# Patient Record
Sex: Female | Born: 1975 | Race: White | Hispanic: No | Marital: Married | Smoking: Never smoker
Health system: Southern US, Community
[De-identification: ages and names within clinical notes are randomized; demographics above are authoritative.]

## PROBLEM LIST (undated history)

## (undated) DIAGNOSIS — F329 Major depressive disorder, single episode, unspecified: Secondary | ICD-10-CM

## (undated) DIAGNOSIS — IMO0001 Reserved for inherently not codable concepts without codable children: Secondary | ICD-10-CM

## (undated) DIAGNOSIS — O480 Post-term pregnancy: Secondary | ICD-10-CM

## (undated) DIAGNOSIS — F419 Anxiety disorder, unspecified: Secondary | ICD-10-CM

## (undated) DIAGNOSIS — F32A Depression, unspecified: Secondary | ICD-10-CM

## (undated) DIAGNOSIS — D219 Benign neoplasm of connective and other soft tissue, unspecified: Secondary | ICD-10-CM

## (undated) HISTORY — PX: TONSILLECTOMY: SUR1361

---

## 2001-10-13 ENCOUNTER — Other Ambulatory Visit: Admission: RE | Admit: 2001-10-13 | Discharge: 2001-10-13 | Payer: Self-pay | Admitting: Obstetrics and Gynecology

## 2004-08-28 ENCOUNTER — Ambulatory Visit (HOSPITAL_COMMUNITY): Admission: RE | Admit: 2004-08-28 | Discharge: 2004-08-28 | Payer: Self-pay | Admitting: Family Medicine

## 2004-12-22 ENCOUNTER — Other Ambulatory Visit: Admission: RE | Admit: 2004-12-22 | Discharge: 2004-12-22 | Payer: Self-pay | Admitting: Obstetrics and Gynecology

## 2008-08-31 ENCOUNTER — Inpatient Hospital Stay (HOSPITAL_COMMUNITY): Admission: AD | Admit: 2008-08-31 | Discharge: 2008-08-31 | Payer: Self-pay | Admitting: Obstetrics and Gynecology

## 2008-09-26 ENCOUNTER — Inpatient Hospital Stay (HOSPITAL_COMMUNITY): Admission: AD | Admit: 2008-09-26 | Discharge: 2008-09-26 | Payer: Self-pay | Admitting: Obstetrics

## 2008-10-12 ENCOUNTER — Inpatient Hospital Stay (HOSPITAL_COMMUNITY): Admission: AD | Admit: 2008-10-12 | Discharge: 2008-10-16 | Payer: Self-pay | Admitting: Obstetrics

## 2008-10-13 ENCOUNTER — Encounter (INDEPENDENT_AMBULATORY_CARE_PROVIDER_SITE_OTHER): Payer: Self-pay | Admitting: Obstetrics

## 2009-08-16 ENCOUNTER — Encounter: Admission: RE | Admit: 2009-08-16 | Discharge: 2009-09-26 | Payer: Self-pay | Admitting: Sports Medicine

## 2011-02-27 NOTE — Op Note (Signed)
Desiree Reese, Desiree Reese            ACCOUNT NO.:  [id number]   MEDICAL RECORD NO.:  [id number]          PATIENT TYPE:  INP   LOCATION:  9117                          FACILITY:  [facility]   PHYSICIAN:  Lendon Colonel, MD   DATE OF BIRTH:  06-28-1976   DATE OF PROCEDURE:  10/13/2008  DATE OF DISCHARGE:                               OPERATIVE REPORT   PREOPERATIVE DIAGNOSIS:  Failure to descend.   POSTOPERATIVE DIAGNOSIS:  Failure to descend.   PROCEDURE:  Primary low-transverse cesarean section.   SURGEON:  Lendon Colonel, MD   ASSISTANT:  Lenoard Aden, MD   ANESTHESIA:  Epidural.   FINDINGS:  Female infant in the LOP position.  Apgars 8 and 9.  Normal  tubes and ovaries, 4 cm subserosal fibroid posterior left.   SPECIMENS:  Placenta to L&D.   ANTIBIOTICS:  2 g of Ancef.   ESTIMATED BLOOD LOSS:  1000.   COMPLICATIONS:  None.   INDICATIONS:  This is a 35 year old G1 at 40 weeks and 5 days being  induced for oligohydramnios postdates after greater than 24-hour  induction most of that in a prolonged latency period with subsequent  rapid progression from 4 cm to 10 cm over 4 hours.  The patient began  pushing, after 3 hours of pushing, fetal occiput was unable to pass the  +2 station with significant molding and caput present.  Decision made to  proceed to the operating room for primary cesarean section.   PROCEDURE:  After informed consent was obtained, the patient was taken  to the operating room where general anesthesia was found to be adequate.  She was prepped and draped in normal sterile fashion in a dorsal spine  position with a leftward tilt.  Foley catheter was inserted in the labor  floor.  Pfannenstiel skin incision was made 2 cm above the pubic  symphysis in the midline, carried through to the underlying layer of  fascia with the Bovie cautery.  The fascia was incised in midline and  the incision extended with the Mayo scissors.  The inferior aspect of  fascial incision was grasped with Kocher clamps, elevated up and down.  The rectus muscles were dissected off sharply.  Attention turned to the  inferior aspect of the fascial incision, which in a similar fashion was  grasped with Kocher clamps, elevated up, and the underlying rectus  muscles dissected off sharply.  The rectus muscles separated in the  midline.  Pyramidalis muscles were separated with the Mayo scissors.  The peritoneum was identified and entered bluntly.  Peritoneal incision  was extended superiorly and inferiorly with good visualization of the  bladder.  Bladder blade was inserted.  The skin and peritoneum was  identified, grasped with pickups and entered sharply, incision was  extended laterally with the Metzenbaum scissors and the bladder flap was  created digitally.  Bladder blade was reinserted.  The lower uterine  segment was incised in transverse fashion with a scalpel and extended  bluntly.  The infant's occiput was grasped, flexed, and delivered  without complication.  Nuchal cord x1 was easily reduced.  The remainder  of the infant was delivered without complication.  The cord was clamped  and cut and the infant was handed off to awaiting pediatricians.  The  uterus was exteriorized after the placenta was expressed.  The uterus  was cleared of all clots and debris.  The uterine incision was closed  with 0-Vicryl in a running locked fashion.  A second layer of the same  suture was used in imbricating fashion to obtain hemostasis.  Some  bleeding from the left apex of the incision was noted and stitch was put  for hemostasis and serosal tear occurred.  Brisk bleeding was noted at  this point and several deep sutures were placed to control hemostasis.  This was a major branch of the uterine artery and bleeding was brisk.  Excellent hemostasis was noted.  Attention was turned to the right side  of the uterus.  Some expansion of hematoma on th right side was noted   underneath the serosa.  Approximately 6 figure-of-eight sutures were  needed to control this bleeding.  Once controlled, the uterus was  returned to the abdomen.  The gutters were cleared of clots and debris.  The pelvis was irrigated with warm normal saline.  The incisions were  reinspected.  Hemostasis was noted.  Peritoneum was closed with 2-0  Vicryl in a running fashion.  The cut muscle edges on either side of the  fascia were inspected, found to be hemostatic.  The fascia was closed  with 0-Vicryl in a running fashion in a single layer.  The subcutaneous  tissue was irrigated and several small capillary bleeders were bovied  for hemostasis and the skin was closed with staples.  The patient  tolerated the procedure well.  Sponge, lap, and needle count was correct  x3.  The patient was taken to recovery room in a stable condition.      Lendon Colonel, MD  Electronically Signed     KAF/MEDQ  D:  10/13/2008  T:  10/14/2008  Job:  [id number]

## 2011-03-02 NOTE — Discharge Summary (Signed)
NAMEDEBI, COUSIN            ACCOUNT NO.:  [id number]   MEDICAL RECORD NO.:  [id number]          PATIENT TYPE:  INP   LOCATION:  9117                          FACILITY:  [facility]   PHYSICIAN:  Lendon Colonel, MD   DATE OF BIRTH:  October 09, 1976   DATE OF ADMISSION:  10/12/2008  DATE OF DISCHARGE:  10/16/2008                               DISCHARGE SUMMARY   CHIEF COMPLAINT:  Oligohydramnios.   HISTORY OF PRESENT ILLNESS:  This is a 35 year old G1 presenting at 40  weeks and 5 days, who on postdates testing was found to have an AFI of  6.5, reactive NST, and a BPP of 8/8.  However, the decision was made to  proceed with induction of labor due to postdate oligohydramnios.  Prenatal issues are significant for GBS positive and yeast vaginitis.  Remainder of the past medical, surgical, obstetrical, and gynecologic  history is as per her admission H&P.  On admission, she was afebrile  with stable vitals.  Her exam was benign.  Her cervix was 2 cm dilated,  80% effaced with a vertex in a -2.  Decision was made to begin Pitocin  and AROM when in active labor.  Fetal well-being was reassuring and  penicillin was started for GBS positivity.  Later that day, Pitocin had  gotten up to 20 milliunits per minute, although contractions were rare.  Her cervix progressed to 3, 50, and -2.  There was no adequate amniotic  sac for rupture of membranes.  By 10:00 p.m., on day of admission, the  patient was still very comfortable with her contractions every 2-3  minutes.  Cervix remained at 3 cm and there was an asynclitic  presentation.  By midnight, Pitocin had increased to 24 milliunits per  minute.  The patient is now contracting every 2 minutes and was very  comfortable.  The decision was then made to stop the Pitocin by 3 in the  morning given the cervix had now progressed past 4 cm and the patient  was sleeping without feeling contractions without any medications.  Pitocin was stopped for about 4  hours and then was restarted.  The fetal  tracing remained reactive.  On hospital day #2, the patient finally  began getting uncomfortable and by 1:15 in the p.m., an epidural was  placed.  There was some molding noted.  The cervix had progressed to 5  cm and 90% effaced.  The patient rapidly progressed over the next 4  hours and patient began pushing.  After about 3 hours of pushing without  any fetal descent, decision was made to proceed with a primary cesarean  section. That note can be found in a separate dictation.  In short, baby  was in the OP position with Apgars 8 and 9.  Normal tubes and ovaries  were noted and a 4-cm subserosal fibroid was noted.  EBL of 1000 mL.  Postoperatively, the patient did well.  She had a significant amount of  edema secondary to fluid shift.  She did receive some  hydrochlorothiazide, which did help improve urine output and by postop  day #3,  the patient was discharged to home.   DISCHARGE DIAGNOSES:  Failed induction of labor for postdates  oligohydramnios, status post primary cesarean section.   DISCHARGE DISPOSITION:  To home.   DISCHARGE CONDITION:  Stable.   DISCHARGE MEDICATIONS:  Motrin, Colace, and Percocet.      Lendon Colonel, MD  Electronically Signed     KAF/MEDQ  D:  11/14/2008  T:  11/15/2008  Job:  [id number]

## 2011-07-20 LAB — CBC
HCT: 36.2 % (ref 36.0–46.0)
HCT: 38.4 % (ref 36.0–46.0)
Hemoglobin: 12.1 g/dL (ref 12.0–15.0)
Hemoglobin: 13.1 g/dL (ref 12.0–15.0)
MCHC: 33.5 g/dL (ref 30.0–36.0)
MCHC: 34 g/dL (ref 30.0–36.0)
MCV: 97.7 fL (ref 78.0–100.0)
MCV: 99.8 fL (ref 78.0–100.0)
Platelets: 199 10*3/uL (ref 150–400)
Platelets: 200 10*3/uL (ref 150–400)
RBC: 3.62 MIL/uL — ABNORMAL LOW (ref 3.87–5.11)
RBC: 3.93 MIL/uL (ref 3.87–5.11)
RDW: 14 % (ref 11.5–15.5)
RDW: 14.4 % (ref 11.5–15.5)
WBC: 12.7 10*3/uL — ABNORMAL HIGH (ref 4.0–10.5)
WBC: 23.4 10*3/uL — ABNORMAL HIGH (ref 4.0–10.5)

## 2011-07-20 LAB — RPR: RPR Ser Ql: NONREACTIVE

## 2014-05-03 LAB — OB RESULTS CONSOLE PLATELET COUNT: Platelets: 217 10*3/uL

## 2014-05-03 LAB — OB RESULTS CONSOLE ABO/RH: RH Type: POSITIVE

## 2014-05-03 LAB — OB RESULTS CONSOLE RUBELLA ANTIBODY, IGM: Rubella: IMMUNE

## 2014-05-03 LAB — OB RESULTS CONSOLE RPR: RPR: NONREACTIVE

## 2014-05-03 LAB — OB RESULTS CONSOLE HGB/HCT, BLOOD
HCT: 39 %
Hemoglobin: 13.7 g/dL

## 2014-05-03 LAB — OB RESULTS CONSOLE HEPATITIS B SURFACE ANTIGEN: Hepatitis B Surface Ag: NEGATIVE

## 2014-05-03 LAB — OB RESULTS CONSOLE ANTIBODY SCREEN: Antibody Screen: NEGATIVE

## 2014-05-03 LAB — OB RESULTS CONSOLE HIV ANTIBODY (ROUTINE TESTING): HIV: NONREACTIVE

## 2014-05-03 LAB — OB RESULTS CONSOLE GC/CHLAMYDIA
Chlamydia: NEGATIVE
GC PROBE AMP, GENITAL: NEGATIVE

## 2014-11-11 LAB — OB RESULTS CONSOLE GBS: GBS: NEGATIVE

## 2014-12-15 ENCOUNTER — Inpatient Hospital Stay (HOSPITAL_COMMUNITY): Payer: BLUE CROSS/BLUE SHIELD

## 2014-12-15 ENCOUNTER — Inpatient Hospital Stay (HOSPITAL_COMMUNITY)
Admission: AD | Admit: 2014-12-15 | Discharge: 2014-12-15 | Disposition: A | Discharge status: Home / Self Care | Payer: BLUE CROSS/BLUE SHIELD | Source: Ambulatory Visit | Attending: Obstetrics | Admitting: Obstetrics

## 2014-12-15 ENCOUNTER — Encounter (HOSPITAL_COMMUNITY): Payer: Self-pay | Admitting: *Deleted

## 2014-12-15 DIAGNOSIS — O26899 Other specified pregnancy related conditions, unspecified trimester: Secondary | ICD-10-CM

## 2014-12-15 DIAGNOSIS — O3421 Maternal care for scar from previous cesarean delivery: Secondary | ICD-10-CM | POA: Insufficient documentation

## 2014-12-15 DIAGNOSIS — Z3A40 40 weeks gestation of pregnancy: Secondary | ICD-10-CM | POA: Insufficient documentation

## 2014-12-15 DIAGNOSIS — R109 Unspecified abdominal pain: Secondary | ICD-10-CM

## 2014-12-15 HISTORY — DX: Depression, unspecified: F32.A

## 2014-12-15 HISTORY — DX: Anxiety disorder, unspecified: F41.9

## 2014-12-15 HISTORY — DX: Benign neoplasm of connective and other soft tissue, unspecified: D21.9

## 2014-12-15 HISTORY — DX: Major depressive disorder, single episode, unspecified: F32.9

## 2014-12-15 NOTE — Discharge Instructions (Signed)
Keep your appointment for prenatal care. Call the office or midwife on call with further concerns.

## 2014-12-15 NOTE — MAU Note (Signed)
States she has had UC's during the night. States abdomen feels tight and tender to touch. Feels like it stays tighter longer than it should. Was concerned since she had a previous C/S.

## 2014-12-24 ENCOUNTER — Inpatient Hospital Stay (HOSPITAL_COMMUNITY)
Admission: AD | Admit: 2014-12-24 | Discharge: 2014-12-27 | DRG: 766 | Disposition: A | Discharge status: Home / Self Care | Payer: BLUE CROSS/BLUE SHIELD | Source: Ambulatory Visit | Attending: Obstetrics | Admitting: Obstetrics

## 2014-12-24 ENCOUNTER — Encounter (HOSPITAL_COMMUNITY): Payer: Self-pay | Admitting: *Deleted

## 2014-12-24 DIAGNOSIS — IMO0001 Reserved for inherently not codable concepts without codable children: Secondary | ICD-10-CM

## 2014-12-24 DIAGNOSIS — Z3A41 41 weeks gestation of pregnancy: Secondary | ICD-10-CM | POA: Diagnosis present

## 2014-12-24 DIAGNOSIS — N858 Other specified noninflammatory disorders of uterus: Secondary | ICD-10-CM | POA: Diagnosis present

## 2014-12-24 DIAGNOSIS — F329 Major depressive disorder, single episode, unspecified: Secondary | ICD-10-CM | POA: Diagnosis present

## 2014-12-24 DIAGNOSIS — O3421 Maternal care for scar from previous cesarean delivery: Secondary | ICD-10-CM | POA: Diagnosis present

## 2014-12-24 DIAGNOSIS — O99344 Other mental disorders complicating childbirth: Secondary | ICD-10-CM | POA: Diagnosis present

## 2014-12-24 DIAGNOSIS — F419 Anxiety disorder, unspecified: Secondary | ICD-10-CM | POA: Diagnosis present

## 2014-12-24 DIAGNOSIS — O480 Post-term pregnancy: Secondary | ICD-10-CM

## 2014-12-24 HISTORY — DX: Reserved for inherently not codable concepts without codable children: IMO0001

## 2014-12-24 HISTORY — DX: Post-term pregnancy: O48.0

## 2014-12-24 LAB — CBC
HCT: 42.4 % (ref 36.0–46.0)
HEMOGLOBIN: 14.7 g/dL (ref 12.0–15.0)
MCH: 31.1 pg (ref 26.0–34.0)
MCHC: 34.7 g/dL (ref 30.0–36.0)
MCV: 89.6 fL (ref 78.0–100.0)
PLATELETS: 212 10*3/uL (ref 150–400)
RBC: 4.73 MIL/uL (ref 3.87–5.11)
RDW: 13.4 % (ref 11.5–15.5)
WBC: 13.9 10*3/uL — ABNORMAL HIGH (ref 4.0–10.5)

## 2014-12-24 MED ORDER — OXYTOCIN BOLUS FROM INFUSION
500.0000 mL | INTRAVENOUS | Status: DC
Start: 2014-12-24 — End: 2014-12-25

## 2014-12-24 MED ORDER — CITRIC ACID-SODIUM CITRATE 334-500 MG/5ML PO SOLN
30.0000 mL | ORAL | Status: DC | PRN
  Administered 2014-12-25: 30 mL via ORAL
  Filled 2014-12-24 (×2): qty 15

## 2014-12-24 MED ORDER — ACETAMINOPHEN 325 MG PO TABS: 650.0000 mg | ORAL_TABLET | ORAL | Status: DC | PRN

## 2014-12-24 MED ORDER — LIDOCAINE HCL (PF) 1 % IJ SOLN
INTRAMUSCULAR | Status: DC
Start: 2014-12-24 — End: 2014-12-25
  Filled 2014-12-24: qty 30

## 2014-12-24 MED ORDER — OXYCODONE-ACETAMINOPHEN 5-325 MG PO TABS: 1.0000 | ORAL_TABLET | ORAL | Status: DC | PRN

## 2014-12-24 MED ORDER — LACTATED RINGERS IV SOLN: 500.0000 mL | INTRAVENOUS | Status: DC | PRN

## 2014-12-24 MED ORDER — OXYTOCIN 40 UNITS IN LACTATED RINGERS INFUSION - SIMPLE MED
INTRAVENOUS | Status: AC
  Filled 2014-12-24: qty 1000

## 2014-12-24 MED ORDER — ONDANSETRON HCL 4 MG/2ML IJ SOLN: 4.0000 mg | Freq: Four times a day (QID) | INTRAMUSCULAR | Status: DC | PRN

## 2014-12-24 MED ORDER — LIDOCAINE HCL (PF) 1 % IJ SOLN: 30.0000 mL | INTRAMUSCULAR | Status: DC | PRN

## 2014-12-24 MED ORDER — OXYTOCIN 40 UNITS IN LACTATED RINGERS INFUSION - SIMPLE MED: 62.5000 mL/h | INTRAVENOUS | Status: DC

## 2014-12-24 MED ORDER — OXYCODONE-ACETAMINOPHEN 5-325 MG PO TABS: 2.0000 | ORAL_TABLET | ORAL | Status: DC | PRN

## 2014-12-24 MED ORDER — FLEET ENEMA 7-19 GM/118ML RE ENEM: 1.0000 | ENEMA | Freq: Every day | RECTAL | Status: DC | PRN

## 2014-12-24 MED ORDER — LACTATED RINGERS IV SOLN: INTRAVENOUS | Status: DC

## 2014-12-24 NOTE — H&P (Signed)
OB ADMISSION/ HISTORY & PHYSICAL:  Admission Date: 12/24/2014  9:21 PM  Admit Diagnosis: Active Labor / Postterm pregnancy at 41.4 wks / TOLAC  Desiree Reese is a 39 y.o. female presenting for active labor. She has been contracting since 1645 today.  She was seen in the office today, 3-4/90% with BBOW; membranes stripped.  Prenatal History: G2P1001   EDC : 12/13/2014, LMP& ultrasound  Prenatal care at [facility] since [redacted] weeks gestation Primary Care Provider at [facility]: Dr. Pamala Hurry / CNMs  Prenatal course complicated by previous cesarean delivery / postterm pregnancy  Prenatal Labs: ABO, Rh: O (07/20 0000)  Antibody: Negative (07/20 0000) Rubella: Immune (07/20 0000)  RPR: Nonreactive (07/20 0000)  HBsAg: Negative (07/20 0000)  HIV: Non-reactive (07/20 0000)  GBS: Negative (01/28 0000)  1 hr Glucola : Normal - 134 mg/dL   Medical / Surgical History :  Past medical history:  Past Medical History  Diagnosis Date  . Depression   . Anxiety   . Fibroid   . Active labor 12/24/2014  . Post-term pregnancy, 40-42 weeks of gestation 12/24/2014  . Active labor 12/24/2014     Past surgical history:  Past Surgical History  Procedure Laterality Date  . Cesarean section    . Tonsillectomy       Family History: History reviewed. No pertinent family history.   Social History:  reports that she has never smoked. She has never used smokeless tobacco. She reports that she drinks alcohol. She reports that she does not use illicit drugs.   Allergies: Review of patient's allergies indicates no known allergies.    Current Medications at time of admission:  Prescriptions prior to admission  Medication Sig Dispense Refill Last Dose  . calcium carbonate (TUMS - DOSED IN MG ELEMENTAL CALCIUM) 500 MG chewable tablet Chew 2 tablets by mouth daily as needed for indigestion or heartburn.   12/15/2014 at Unknown time  . Prenatal Vit-Fe Fumarate-FA (PRENATAL  MULTIVITAMIN) TABS tablet Take 1 tablet by mouth daily at 12 noon.   12/14/2014 at Unknown time      Review of Systems: Review of Systems  Constitutional: Negative.   HENT: Negative.   Eyes: Negative.   Respiratory: Negative.   Cardiovascular: Negative.   Gastrointestinal: Negative.   Genitourinary:       UC's since 1645; bloody show and mucousy vaginal d/c  Musculoskeletal: Negative.   Skin: Negative.   Neurological: Negative.   Endo/Heme/Allergies: Negative.   Psychiatric/Behavioral: Negative.          Physical Exam:  Dilation: 8.5 Effacement (%): 100 Station: 0 Exam by:: Renato Battles, CNM   Today's Vitals   12/24/14 2159  Height: 5\' 4"  (1.626 m)  Weight: 75.751 kg (167 lb)  PainSc: 10-Worst pain ever   General: A&O x 3, mildly anxious with contractions Heart: RRR, no murmurs Lungs: CTAB Abdomen: gravid, soft between contractions, non-tender Extremities: atraumatic, normal ROM, no edema Genitalia: normal, no varicosities FHR: 130 / moderate variability / (+) accels / variable decels TOCO: regular every 2 minutes  Labs:    No results found for this or any previous visit (from the past 24 hour(s)).  Assessment:  39 y.o. G2P1001 at [redacted]w[redacted]d  1. Labor: active 2. Fetal Wellbeing: Category 1  3. Pain Control: continuous labor support from spouse, doula , CNM, and RN 4. GBS: Negative 5. TOLAC  Plan:  1. Admit to BS 2. Routine L&D orders 3. Continuous Fetal Monitoring     Dr Pamala Hurry  notified of admission / plan of care    Graceann Congress, MSN, CNM 12/24/2014, 10:06 PM

## 2014-12-25 ENCOUNTER — Encounter (HOSPITAL_COMMUNITY)
Admission: AD | Disposition: A | Discharge status: Home / Self Care | Payer: Self-pay | Source: Ambulatory Visit | Attending: Obstetrics

## 2014-12-25 ENCOUNTER — Inpatient Hospital Stay (HOSPITAL_COMMUNITY): Payer: BLUE CROSS/BLUE SHIELD | Admitting: Anesthesiology

## 2014-12-25 ENCOUNTER — Encounter (HOSPITAL_COMMUNITY): Payer: Self-pay | Admitting: Anesthesiology

## 2014-12-25 SURGERY — Surgical Case
Anesthesia: Epidural | Site: Abdomen

## 2014-12-25 MED ORDER — SCOPOLAMINE 1 MG/3DAYS TD PT72: 1.0000 | MEDICATED_PATCH | Freq: Once | TRANSDERMAL | Status: DC

## 2014-12-25 MED ORDER — SIMETHICONE 80 MG PO CHEW
80.0000 mg | CHEWABLE_TABLET | Freq: Three times a day (TID) | ORAL | Status: DC
  Administered 2014-12-25 – 2014-12-26 (×4): 80 mg via ORAL
  Filled 2014-12-25 (×3): qty 1

## 2014-12-25 MED ORDER — PHENYLEPHRINE 40 MCG/ML (10ML) SYRINGE FOR IV PUSH (FOR BLOOD PRESSURE SUPPORT): 80.0000 ug | PREFILLED_SYRINGE | INTRAVENOUS | Status: DC | PRN

## 2014-12-25 MED ORDER — LANOLIN HYDROUS EX OINT: 1.0000 "application " | TOPICAL_OINTMENT | CUTANEOUS | Status: DC | PRN

## 2014-12-25 MED ORDER — MORPHINE SULFATE (PF) 0.5 MG/ML IJ SOLN
INTRAMUSCULAR | Status: DC | PRN
  Administered 2014-12-25: 4 mg via EPIDURAL

## 2014-12-25 MED ORDER — ONDANSETRON HCL 4 MG/2ML IJ SOLN
INTRAMUSCULAR | Status: AC
  Filled 2014-12-25: qty 2

## 2014-12-25 MED ORDER — ONDANSETRON HCL 4 MG/2ML IJ SOLN: 4.0000 mg | INTRAMUSCULAR | Status: DC | PRN

## 2014-12-25 MED ORDER — NALBUPHINE HCL 10 MG/ML IJ SOLN: 5.0000 mg | Freq: Once | INTRAMUSCULAR | Status: AC | PRN

## 2014-12-25 MED ORDER — LACTATED RINGERS IV SOLN
INTRAVENOUS | Status: DC | PRN
  Administered 2014-12-25: 05:00:00 via INTRAVENOUS

## 2014-12-25 MED ORDER — SODIUM BICARBONATE 8.4 % IV SOLN
INTRAVENOUS | Status: AC
  Filled 2014-12-25: qty 50

## 2014-12-25 MED ORDER — PRENATAL MULTIVITAMIN CH
1.0000 | ORAL_TABLET | Freq: Every day | ORAL | Status: DC
  Administered 2014-12-26: 1 via ORAL

## 2014-12-25 MED ORDER — DIPHENHYDRAMINE HCL 50 MG/ML IJ SOLN: 12.5000 mg | INTRAMUSCULAR | Status: DC | PRN

## 2014-12-25 MED ORDER — MORPHINE SULFATE 0.5 MG/ML IJ SOLN
INTRAMUSCULAR | Status: AC
  Filled 2014-12-25: qty 10

## 2014-12-25 MED ORDER — PHENYLEPHRINE 40 MCG/ML (10ML) SYRINGE FOR IV PUSH (FOR BLOOD PRESSURE SUPPORT)
80.0000 ug | PREFILLED_SYRINGE | INTRAVENOUS | Status: DC | PRN
  Filled 2014-12-25: qty 20

## 2014-12-25 MED ORDER — SCOPOLAMINE 1 MG/3DAYS TD PT72
MEDICATED_PATCH | TRANSDERMAL | Status: AC
  Filled 2014-12-25: qty 1

## 2014-12-25 MED ORDER — CEFAZOLIN SODIUM-DEXTROSE 2-3 GM-% IV SOLR
2.0000 g | Freq: Once | INTRAVENOUS | Status: DC
  Filled 2014-12-25: qty 50

## 2014-12-25 MED ORDER — KETOROLAC TROMETHAMINE 30 MG/ML IJ SOLN: 30.0000 mg | Freq: Four times a day (QID) | INTRAMUSCULAR | Status: AC | PRN

## 2014-12-25 MED ORDER — OXYTOCIN 40 UNITS IN LACTATED RINGERS INFUSION - SIMPLE MED: 62.5000 mL/h | INTRAVENOUS | Status: AC

## 2014-12-25 MED ORDER — HYDROMORPHONE HCL 1 MG/ML IJ SOLN
INTRAMUSCULAR | Status: AC
  Administered 2014-12-25: 0.5 mg via INTRAVENOUS
  Filled 2014-12-25: qty 1

## 2014-12-25 MED ORDER — DEXAMETHASONE SODIUM PHOSPHATE 10 MG/ML IJ SOLN
INTRAMUSCULAR | Status: AC
  Filled 2014-12-25: qty 1

## 2014-12-25 MED ORDER — ZOLPIDEM TARTRATE 5 MG PO TABS: 5.0000 mg | ORAL_TABLET | Freq: Every evening | ORAL | Status: DC | PRN

## 2014-12-25 MED ORDER — ONDANSETRON HCL 4 MG PO TABS: 4.0000 mg | ORAL_TABLET | ORAL | Status: DC | PRN

## 2014-12-25 MED ORDER — SIMETHICONE 80 MG PO CHEW
80.0000 mg | CHEWABLE_TABLET | ORAL | Status: DC
  Administered 2014-12-25 – 2014-12-27 (×2): 80 mg via ORAL
  Filled 2014-12-25: qty 1

## 2014-12-25 MED ORDER — MEPERIDINE HCL 25 MG/ML IJ SOLN: 6.2500 mg | INTRAMUSCULAR | Status: DC | PRN

## 2014-12-25 MED ORDER — ONDANSETRON HCL 4 MG/2ML IJ SOLN
INTRAMUSCULAR | Status: DC | PRN
  Administered 2014-12-25: 4 mg via INTRAVENOUS

## 2014-12-25 MED ORDER — KETOROLAC TROMETHAMINE 30 MG/ML IJ SOLN
INTRAMUSCULAR | Status: AC
Start: 2014-12-25 — End: 2014-12-25
  Administered 2014-12-25: 30 mg via INTRAVENOUS
  Filled 2014-12-25: qty 1

## 2014-12-25 MED ORDER — KETOROLAC TROMETHAMINE 30 MG/ML IJ SOLN
30.0000 mg | Freq: Once | INTRAMUSCULAR | Status: AC | PRN
  Administered 2014-12-25: 30 mg via INTRAVENOUS

## 2014-12-25 MED ORDER — SODIUM CHLORIDE 0.9 % IJ SOLN: 3.0000 mL | INTRAMUSCULAR | Status: DC | PRN

## 2014-12-25 MED ORDER — FENTANYL 2.5 MCG/ML BUPIVACAINE 1/10 % EPIDURAL INFUSION (WH - ANES)
14.0000 mL/h | INTRAMUSCULAR | Status: DC | PRN
  Filled 2014-12-25: qty 125

## 2014-12-25 MED ORDER — NALOXONE HCL 1 MG/ML IJ SOLN: 1.0000 ug/kg/h | INTRAVENOUS | Status: DC | PRN

## 2014-12-25 MED ORDER — MENTHOL 3 MG MT LOZG
1.0000 | LOZENGE | OROMUCOSAL | Status: DC | PRN
  Administered 2014-12-25: 3 mg via ORAL
  Filled 2014-12-25: qty 9

## 2014-12-25 MED ORDER — IBUPROFEN 600 MG PO TABS
600.0000 mg | ORAL_TABLET | Freq: Four times a day (QID) | ORAL | Status: DC
  Administered 2014-12-25 – 2014-12-27 (×7): 600 mg via ORAL
  Filled 2014-12-25 (×6): qty 1

## 2014-12-25 MED ORDER — OXYCODONE-ACETAMINOPHEN 5-325 MG PO TABS
2.0000 | ORAL_TABLET | ORAL | Status: DC | PRN
  Administered 2014-12-26 – 2014-12-27 (×3): 2 via ORAL
  Filled 2014-12-25 (×3): qty 2

## 2014-12-25 MED ORDER — NALBUPHINE HCL 10 MG/ML IJ SOLN: 5.0000 mg | INTRAMUSCULAR | Status: DC | PRN

## 2014-12-25 MED ORDER — SODIUM BICARBONATE 8.4 % IV SOLN
INTRAVENOUS | Status: DC | PRN
  Administered 2014-12-25 (×3): 5 mL via EPIDURAL

## 2014-12-25 MED ORDER — EPHEDRINE 5 MG/ML INJ: 10.0000 mg | INTRAVENOUS | Status: DC | PRN

## 2014-12-25 MED ORDER — PROMETHAZINE HCL 25 MG/ML IJ SOLN
6.2500 mg | INTRAMUSCULAR | Status: DC | PRN
Start: 2014-12-25 — End: 2014-12-25

## 2014-12-25 MED ORDER — HYDROMORPHONE HCL 1 MG/ML IJ SOLN
0.2500 mg | INTRAMUSCULAR | Status: DC | PRN
  Administered 2014-12-25: 0.5 mg via INTRAVENOUS

## 2014-12-25 MED ORDER — DIPHENHYDRAMINE HCL 25 MG PO CAPS: 25.0000 mg | ORAL_CAPSULE | Freq: Four times a day (QID) | ORAL | Status: DC | PRN

## 2014-12-25 MED ORDER — 0.9 % SODIUM CHLORIDE (POUR BTL) OPTIME
TOPICAL | Status: DC | PRN
  Administered 2014-12-25: 1000 mL

## 2014-12-25 MED ORDER — LIDOCAINE-EPINEPHRINE (PF) 2 %-1:200000 IJ SOLN
INTRAMUSCULAR | Status: AC
  Filled 2014-12-25: qty 20

## 2014-12-25 MED ORDER — OXYTOCIN 10 UNIT/ML IJ SOLN
INTRAMUSCULAR | Status: AC
  Filled 2014-12-25: qty 4

## 2014-12-25 MED ORDER — IBUPROFEN 600 MG PO TABS
600.0000 mg | ORAL_TABLET | Freq: Four times a day (QID) | ORAL | Status: DC | PRN
Start: 2014-12-25 — End: 2014-12-25

## 2014-12-25 MED ORDER — WITCH HAZEL-GLYCERIN EX PADS: 1.0000 "application " | MEDICATED_PAD | CUTANEOUS | Status: DC | PRN

## 2014-12-25 MED ORDER — CEFAZOLIN SODIUM-DEXTROSE 2-3 GM-% IV SOLR
INTRAVENOUS | Status: AC
  Filled 2014-12-25: qty 50

## 2014-12-25 MED ORDER — FENTANYL 2.5 MCG/ML BUPIVACAINE 1/10 % EPIDURAL INFUSION (WH - ANES)
INTRAMUSCULAR | Status: DC | PRN
  Administered 2014-12-25: 14 mL/h via EPIDURAL

## 2014-12-25 MED ORDER — SENNOSIDES-DOCUSATE SODIUM 8.6-50 MG PO TABS
2.0000 | ORAL_TABLET | ORAL | Status: DC
  Administered 2014-12-25 – 2014-12-27 (×2): 2 via ORAL
  Filled 2014-12-25 (×2): qty 2

## 2014-12-25 MED ORDER — NALOXONE HCL 0.4 MG/ML IJ SOLN: 0.4000 mg | INTRAMUSCULAR | Status: DC | PRN

## 2014-12-25 MED ORDER — DIBUCAINE 1 % RE OINT: 1.0000 "application " | TOPICAL_OINTMENT | RECTAL | Status: DC | PRN

## 2014-12-25 MED ORDER — OXYTOCIN 10 UNIT/ML IJ SOLN
40.0000 [IU] | INTRAVENOUS | Status: DC | PRN
  Administered 2014-12-25: 40 [IU] via INTRAVENOUS

## 2014-12-25 MED ORDER — SIMETHICONE 80 MG PO CHEW
80.0000 mg | CHEWABLE_TABLET | ORAL | Status: DC | PRN
  Administered 2014-12-27 (×2): 80 mg via ORAL
  Filled 2014-12-25 (×2): qty 1

## 2014-12-25 MED ORDER — CEFAZOLIN SODIUM-DEXTROSE 2-3 GM-% IV SOLR
INTRAVENOUS | Status: DC | PRN
  Administered 2014-12-25: 2 g via INTRAVENOUS

## 2014-12-25 MED ORDER — LACTATED RINGERS IV SOLN
INTRAVENOUS | Status: DC
  Administered 2014-12-25: 15:00:00 via INTRAVENOUS

## 2014-12-25 MED ORDER — DIPHENHYDRAMINE HCL 25 MG PO CAPS: 25.0000 mg | ORAL_CAPSULE | ORAL | Status: DC | PRN

## 2014-12-25 MED ORDER — LIDOCAINE HCL (PF) 1 % IJ SOLN
INTRAMUSCULAR | Status: DC | PRN
Start: 2014-12-25 — End: 2014-12-25
  Administered 2014-12-25 (×2): 8 mL

## 2014-12-25 MED ORDER — ONDANSETRON HCL 4 MG/2ML IJ SOLN: 4.0000 mg | Freq: Three times a day (TID) | INTRAMUSCULAR | Status: DC | PRN

## 2014-12-25 MED ORDER — DEXAMETHASONE SODIUM PHOSPHATE 10 MG/ML IJ SOLN
INTRAMUSCULAR | Status: DC | PRN
  Administered 2014-12-25: 10 mg via INTRAVENOUS

## 2014-12-25 MED ORDER — NALBUPHINE HCL 10 MG/ML IJ SOLN
5.0000 mg | INTRAMUSCULAR | Status: DC | PRN
  Administered 2014-12-25: 5 mg via INTRAVENOUS
  Filled 2014-12-25: qty 1

## 2014-12-25 MED ORDER — SCOPOLAMINE 1 MG/3DAYS TD PT72
MEDICATED_PATCH | TRANSDERMAL | Status: DC | PRN
  Administered 2014-12-25: 1 via TRANSDERMAL

## 2014-12-25 MED ORDER — OXYCODONE-ACETAMINOPHEN 5-325 MG PO TABS
1.0000 | ORAL_TABLET | ORAL | Status: DC | PRN
  Administered 2014-12-26: 1 via ORAL
  Filled 2014-12-25: qty 1

## 2014-12-25 MED ORDER — LACTATED RINGERS IV SOLN
INTRAVENOUS | Status: DC | PRN
  Administered 2014-12-25 (×4): via INTRAVENOUS

## 2014-12-25 MED ORDER — LACTATED RINGERS IV SOLN
500.0000 mL | Freq: Once | INTRAVENOUS | Status: AC
  Administered 2014-12-25: 500 mL via INTRAVENOUS

## 2014-12-25 MED ORDER — TETANUS-DIPHTH-ACELL PERTUSSIS 5-2.5-18.5 LF-MCG/0.5 IM SUSP: 0.5000 mL | Freq: Once | INTRAMUSCULAR | Status: DC

## 2014-12-25 SURGICAL SUPPLY — 37 items
APL SKNCLS STERI-STRIP NONHPOA (GAUZE/BANDAGES/DRESSINGS) ×1
BENZOIN TINCTURE PRP APPL 2/3 (GAUZE/BANDAGES/DRESSINGS) ×1 IMPLANT
CLAMP CORD UMBIL (MISCELLANEOUS) ×1 IMPLANT
CLOTH BEACON ORANGE TIMEOUT ST (SAFETY) ×2 IMPLANT
CONTAINER PREFILL 10% NBF 15ML (MISCELLANEOUS) IMPLANT
DRAPE SHEET LG 3/4 BI-LAMINATE (DRAPES) ×1 IMPLANT
DRSG OPSITE POSTOP 4X10 (GAUZE/BANDAGES/DRESSINGS) ×2 IMPLANT
DURAPREP 26ML APPLICATOR (WOUND CARE) ×2 IMPLANT
ELECT REM PT RETURN 9FT ADLT (ELECTROSURGICAL) ×2
ELECTRODE REM PT RTRN 9FT ADLT (ELECTROSURGICAL) ×1 IMPLANT
EXTRACTOR VACUUM M CUP 4 TUBE (SUCTIONS) IMPLANT
GLOVE BIO SURGEON STRL SZ 6.5 (GLOVE) ×2 IMPLANT
GLOVE BIOGEL PI IND STRL 7.0 (GLOVE) ×1 IMPLANT
GLOVE BIOGEL PI INDICATOR 7.0 (GLOVE) ×1
GOWN STRL REUS W/TWL LRG LVL3 (GOWN DISPOSABLE) ×4 IMPLANT
KIT ABG SYR 3ML LUER SLIP (SYRINGE) IMPLANT
NDL HYPO 25X5/8 SAFETYGLIDE (NEEDLE) IMPLANT
NEEDLE HYPO 25X5/8 SAFETYGLIDE (NEEDLE) IMPLANT
NS IRRIG 1000ML POUR BTL (IV SOLUTION) ×2 IMPLANT
PACK C SECTION WH (CUSTOM PROCEDURE TRAY) ×2 IMPLANT
PAD OB MATERNITY 4.3X12.25 (PERSONAL CARE ITEMS) ×2 IMPLANT
STAPLER VISISTAT 35W (STAPLE) IMPLANT
STRIP CLOSURE SKIN 1/2X4 (GAUZE/BANDAGES/DRESSINGS) ×1 IMPLANT
SUT MON AB 4-0 PS1 27 (SUTURE) ×1 IMPLANT
SUT PLAIN 0 NONE (SUTURE) IMPLANT
SUT PLAIN 2 0 (SUTURE) ×2
SUT PLAIN 2 0 XLH (SUTURE) IMPLANT
SUT PLAIN ABS 2-0 CT1 27XMFL (SUTURE) IMPLANT
SUT VIC AB 0 CT1 36 (SUTURE) ×5 IMPLANT
SUT VIC AB 0 CTX 36 (SUTURE) ×8
SUT VIC AB 0 CTX36XBRD ANBCTRL (SUTURE) ×2 IMPLANT
SUT VIC AB 2-0 CT1 27 (SUTURE) ×2
SUT VIC AB 2-0 CT1 TAPERPNT 27 (SUTURE) ×1 IMPLANT
SUT VIC AB 3-0 SH 27 (SUTURE) ×2
SUT VIC AB 3-0 SH 27X BRD (SUTURE) IMPLANT
TOWEL OR 17X24 6PK STRL BLUE (TOWEL DISPOSABLE) ×2 IMPLANT
TRAY FOLEY CATH 14FR (SET/KITS/TRAYS/PACK) IMPLANT

## 2014-12-25 NOTE — Op Note (Signed)
12/24/2014 - 12/25/2014  5:53 AM  PATIENT:  Desiree Reese  39 y.o. female  PRE-OPERATIVE DIAGNOSIS:  Arrested Descent, failed TOLAC  POST-OPERATIVE DIAGNOSIS:  Arrested Descent,  failed TOLAC  PROCEDURE:  Procedure(s): CESAREAN SECTION (N/A)  RCS  SURGEON:  Surgeon(s) and Role:    * Aloha Gell, MD - Primary  PHYSICIAN ASSISTANT:   ASSISTANTS: Renato Battles, CNM   ANESTHESIA:   epidural  EBL:  Total I/O In: 3600 [I.V.:3600] Out: 950 [Urine:150; Blood:800]  BLOOD ADMINISTERED:none  DRAINS: Urinary Catheter (Foley)   LOCAL MEDICATIONS USED:  NONE  SPECIMEN:  Source of Specimen:  placenta  DISPOSITION OF SPECIMEN:  L&D  COUNTS:  YES  TOURNIQUET:  * No tourniquets in log *  DICTATION: .Note written in EPIC  PLAN OF CARE: Admit to inpatient   PATIENT DISPOSITION:  PACU - hemodynamically stable.   Delay start of Pharmacological VTE agent (>24hrs) due to surgical blood loss or risk of bleeding: yes   Findings:  @BABYSEXEBC @ infant,  APGAR (1 MIN): 8   APGAR (5 MINS): 9   APGAR (10 MINS):   Normal uterus, tubes and ovaries, normal placenta with marginal cord insertion. 3VC, clear amniotic fluid Bladder adhesed to anterior uterus  EBL: per anasthesis Antibiotics:   2g Ancef Complications: none  Indications: This is a 39 y.o. year-old, G2P1  At [redacted]w[redacted]d admitted for active labor. Pt labored on own all day at home, presented 8 pm and was 8.5 cm. Given desire for natural labor she was allowed to progress on her own without interventions or exam. By 2 am she was 9.5 cm, Over the next 2 hrs she progressed to full dilation but no descent of the fetal head and decision made for c/s. Risks benefits and alternatives of the procedure were discussed with the patient who agreed to proceed.  Procedure:  After informed consent was obtained the patient was taken to the operating room where epidural anesthesia was found to be adequate.  She was prepped and draped in the normal  sterile fashion in dorsal supine position with a leftward tilt.  A foley catheter was in place.  A Pfannenstiel skin incision was made 2 cm above the pubic symphysis in the midline with the scalpel.  Dissection was carried down with the Bovie cautery until the fascia was reached. The fascia was incised in the midline. The incision was extended laterally with the Mayo scissors. The inferior aspect of the fascial incision was grasped with the Coker clamps, elevated up and the underlying rectus muscles were dissected off sharply. The superior aspect of the fascial incision was grasped with the Coker clamps elevated up and the underlying rectus muscles were dissected off sharply.  The peritoneum was entered sharply. The peritoneal incision was extended superiorly and inferiorly with good visualization of the bladder.  The bladder was adhesed to the anterior uterine wall and taken down sharply. The bladder blade was inserted and palpation was done to assess the fetal position and the location of the uterine vessels. The lower segment of the uterus was incised sharply with the scalpel and extended  bluntly in the cephalo-caudal fashion. The infant was grasped, brought to the incision,  rotated and the infant was delivered with fundal pressure.  The cord was clamped and cut. The infant was handed off to the waiting pediatrician. The placenta was expressed. The uterus was exteriorized. The uterus was cleared of all clots and debris. The uterine incision was repaired with 0 Vicryl in a running locked  fashion.  A second layer of the same suture was used in an imbricating fashion to obtain excellent hemostasis. Several figure of 8 sutures were used in the R angle and along the myometrium superior to the incision and the the right of midline.  The uterus was then returned to the abdomen, the gutters were cleared of all clots and debris. The uterine incision was reinspected and found to be hemostatic. The peritoneum was grasped  and closed with 2-0 Vicryl in a running fashion. The cut muscle edges and the underside of the fascia were inspected and found to be hemostatic. The fascia was closed with 0 Vicryl in two halves . The subcutaneous tissue was irrigated. Scarpa's layer was closed with a 2-0 plain gut suture. The skin was closed with a 4-0 Monocryl in a single layer. The patient tolerated the procedure well. Sponge lap and needle counts were correct x3 and patient was taken to the recovery room in a stable condition.  Vandy Fong A. 12/25/2014 5:55 AM

## 2014-12-25 NOTE — Progress Notes (Signed)
Met with pt to discuss lack of progress.   H/o PCS for arrest of descent.  1cm change from 8.5 to 9.5 cm in 8 hrs  A/P: Arrest of descent. R of procedure d/w pt including bleeding, infection, damage to internal organs  Plan 2 g Ancef Pt agrees to plan  Tashawn Laswell A. 12/25/2014 4:32 AM

## 2014-12-25 NOTE — Transfer of Care (Signed)
Immediate Anesthesia Transfer of Care Note  Patient: Desiree Reese  Procedure(s) Performed: Procedure(s): CESAREAN SECTION (N/A)  Patient Location: PACU  Anesthesia Type:Epidural  Level of Consciousness: awake  Airway & Oxygen Therapy: Patient Spontanous Breathing  Post-op Assessment: Report given to RN and Post -op Vital signs reviewed and stable  Post vital signs: stable  Last Vitals:  Filed Vitals:   12/25/14 0401  BP: 136/99  Pulse: 111  Temp:   Resp:     Complications: No apparent anesthesia complications

## 2014-12-25 NOTE — Anesthesia Postprocedure Evaluation (Signed)
Anesthesia Post Note  Patient: Desiree Reese  Procedure(s) Performed: Procedure(s) (LRB): CESAREAN SECTION (N/A)  Anesthesia type: Epidural  Patient location: Mother/Baby  Post pain: Pain level controlled  Post assessment: Post-op Vital signs reviewed  Last Vitals:  Filed Vitals:   12/25/14 1042  BP: 111/71  Pulse: 70  Temp: 36.6 C  Resp: 18    Post vital signs: Reviewed  Level of consciousness:alert  Complications: No apparent anesthesia complications

## 2014-12-25 NOTE — Progress Notes (Signed)
Patient ID: Desiree Reese, female   DOB: 23-Sep-1976, 39 y.o.   MRN: [id number] S: Doing well, pain well-controlled with an epidural. (+) pelvic pressure with some contractions. Requesting a repeat cesarean delivery d/t no progress since last exam.   O: Filed Vitals:   12/25/14 0250 12/25/14 0255 12/25/14 0300 12/25/14 0330  BP: 137/75 132/70 120/81 114/81  Pulse: 100 85 85 102  Temp:   97.6 F (36.4 C)   TempSrc:   Oral   Resp:   16 18  Height:      Weight:      SpO2:         FHT:  FHR: 130 bpm, variability: moderate,  accelerations:  Present,  decelerations:  Present occ variable UC:   regular, every 4-8 minutes SVE:   Dilation: Lip/rim Effacement (%): 100 Station: 0 Exam by:: R.Aissa Lisowski, CNM   A / P: Arrest in active phase of labor  Arrest of descent  Fetal Wellbeing:  Category I Pain Control:  Epidural  Anticipated MOD:  cesrean delivery   *Dr. Pamala Hurry in room discussing surgery with patient and spouse.  Laury Deep, M MSN, CNM 12/25/2014, 4:03 AM

## 2014-12-25 NOTE — Progress Notes (Signed)
Patient ID: Desiree Reese, female   DOB: [birth year], 38 y.o.   MRN: [id number] S: Increasing anxiety with contractions, pain minimally tolerated d/t fatigue. After many position changes (seated, standing, rocking with support of spouse, hands and knees, and exaggerated Sims), patient is requesting an epidural for pain management. (+)  pelvic pressure with contractions. Continuous support of spouse, doula, and CNM.  ODanley Danker Vitals:   12/24/14 2334 12/25/14 0050 12/25/14 0051 12/25/14 0116  BP: 118/71     Pulse: 122 84 89 100  Temp:      TempSrc:      Height:      Weight:      SpO2:    97%     FHT:  FHR: 130 bpm, variability: moderate,  accelerations:  Present,  decelerations:  Present variables / intermittent tracing of maternal HR d/t frequent maternal position changes UC:   regular, every 2-3 minutes SVE:   Dilation: Lip/rim Effacement (%): 100 Station: 0 Exam by:: Sunday Corn, CNM   A / P: Protracted active phase  TOLAC Postterm at 41.5 wks  Fetal Wellbeing:  Category I Pain Control:  Epidural - Dr. Jillyn Hidden at bedside now Left exaggerated Sims position with peanut ball after epidural placement Plan to recheck cervix in 1 hour - consider AROM  Anticipated MOD:  Cautious for NSVD   *Dr. Pamala Hurry consulted/made aware of labor progress and plan to proceed with epidural  Laury Deep, M MSN, CNM 12/25/2014, 2:10 AM

## 2014-12-25 NOTE — Brief Op Note (Signed)
12/24/2014 - 12/25/2014  5:53 AM  PATIENT:  Desiree Reese  39 y.o. female  PRE-OPERATIVE DIAGNOSIS:  Arrested Descent, failed TOLAC  POST-OPERATIVE DIAGNOSIS:  Arrested Descent,  failed TOLAC  PROCEDURE:  Procedure(s): CESAREAN SECTION (N/A)  RCS  SURGEON:  Surgeon(s) and Role:    * Aloha Gell, MD - Primary  PHYSICIAN ASSISTANT:   ASSISTANTS: Renato Battles, CNM   ANESTHESIA:   epidural  EBL:  Total I/O In: 3600 [I.V.:3600] Out: 950 [Urine:150; Blood:800]  BLOOD ADMINISTERED:none  DRAINS: Urinary Catheter (Foley)   LOCAL MEDICATIONS USED:  NONE  SPECIMEN:  Source of Specimen:  placenta  DISPOSITION OF SPECIMEN:  L&D  COUNTS:  YES  TOURNIQUET:  * No tourniquets in log *  DICTATION: .Note written in EPIC  PLAN OF CARE: Admit to inpatient   PATIENT DISPOSITION:  PACU - hemodynamically stable.   Delay start of Pharmacological VTE agent (>24hrs) due to surgical blood loss or risk of bleeding: yes

## 2014-12-25 NOTE — Lactation Note (Signed)
This note was copied from the chart of [facility]. Lactation Consultation Note Experienced BF mom BF her 8 yr. Old daughter for 6 months, but had to supplement d/t low milk supply. Mom stated she done everything she could think of to increase it including taking medication. Denies hx. Of PCOS.  Noticed small space between breast, breast are not longated or tubular. Good everted nipples for deep latch. Mom stated her daughter had a tongue tie. Noted this baby has upper lip labial frenulum, but has good mobility of tongue. Noted w/assessment of suck on gloved finger that baby's left corner side of mouth seal was a little weak at this time. Had good tongue positioning around finger w/good suckling. When entered rm. Mom was BF STS in cradle position body turned inwards down towards mom and baby's head turned upwards to breast. Assisted in positioning, discussed options and assisted in football hold. Mom was excited to see photos in Baby in Me that she took. Referred to Baby and Me Book in Breastfeeding section Pg. 22-23 for position options and Proper latch demonstration.Encouraged comfort during BF so colostrum flows better and mom will enjoy the feeding longer. Taking deep breaths and breast massage during BF.  Discussed deep latch verse shallow latch and causing soreness.  Hand expression reviewed and demonstrated good flow, mom excited. Encouraged breast massage during BF. Since mom has Hx: of low milk supply encouraged to set up DEBP and post-pump after BF. Mom wanted to wait 24 hrs. To see how baby nurses. Stated it stressed her out last time all of the pumping. I stated I can understand and know how tiring that could be, but it is important to start as soon as possible to encourage her milk supply and explained supply and demand, how first 2 weeks lays foundation of furture milk supply. Mom asked for hand pump. Gave her one as requested. Encouraged hand expression at least 5 times a day in the  first 3 days as studies show has increased milk supply but the 8th day. Mom states she would do that.  Mom getting sleepy from BF. Mom encouraged to feed baby 8-12 times/24 hours and with feeding cues.  Educated about newborn behavior. Mom encouraged to do skin-to-skin increases milk supply and bonding. [place] brochure given w/resources, support groups and [place] services. Patient Name: Desiree Reese NOMVE'H Date: 12/25/2014 Reason for consult: Initial assessment   Maternal Data Has patient been taught Hand Expression?: Yes Does the patient have breastfeeding experience prior to this delivery?: Yes  Feeding Feeding Type: Breast Fed Length of feed: 30 min  LATCH Score/Interventions Latch: Grasps breast easily, tongue down, lips flanged, rhythmical sucking.  Audible Swallowing: A few with stimulation Intervention(s): Alternate breast massage;Hand expression  Type of Nipple: Everted at rest and after stimulation  Comfort (Breast/Nipple): Soft / non-tender     Hold (Positioning): Assistance needed to correctly position infant at breast and maintain latch. Intervention(s): Skin to skin;Position options;Support Pillows;Breastfeeding basics reviewed  LATCH Score: 8  Lactation Tools Discussed/Used Tools: Pump Breast pump type: Manual Pump Review: Setup, frequency, and cleaning;Milk Storage Initiated by:: Allayne Stack RN Date initiated:: 12/25/14   Consult Status Consult Status: Follow-up Date: 12/26/14 Follow-up type: In-patient    Theodoro Kalata 12/25/2014, 11:29 AM

## 2014-12-25 NOTE — Anesthesia Procedure Notes (Signed)
Epidural Patient location during procedure: OB Start time: 12/25/2014 2:09 AM End time: 12/25/2014 2:13 AM  Staffing Anesthesiologist: Lyn Hollingshead Performed by: anesthesiologist   Preanesthetic Checklist Completed: patient identified, surgical consent, pre-op evaluation, timeout performed, IV checked, risks and benefits discussed and monitors and equipment checked  Epidural Patient position: sitting Prep: site prepped and draped and DuraPrep Patient monitoring: continuous pulse ox and blood pressure Approach: midline Location: L3-L4 Injection technique: LOR air  Needle:  Needle type: Tuohy  Needle gauge: 17 G Needle length: 9 cm and 9 Needle insertion depth: 5 cm cm Catheter type: closed end flexible Catheter size: 19 Gauge Catheter at skin depth: 10 cm Test dose: negative and Other  Assessment Sensory level: T9 Events: blood not aspirated, injection not painful, no injection resistance, negative IV test and no paresthesia  Additional Notes Reason for block:procedure for pain

## 2014-12-25 NOTE — Anesthesia Postprocedure Evaluation (Signed)
  Anesthesia Post-op Note  Patient: [name]  Procedure(s) Performed: Procedure(s): CESAREAN SECTION (N/A)  Patient Location: PACU  Anesthesia Type:Epidural  Level of Consciousness: awake, alert  and oriented  Airway and Oxygen Therapy: Patient Spontanous Breathing  Post-op Pain: none  Post-op Assessment: Post-op Vital signs reviewed, Patient's Cardiovascular Status Stable, Respiratory Function Stable, Patent Airway, No signs of Nausea or vomiting, Pain level controlled, No headache, No backache, No residual numbness and No residual motor weakness  Post-op Vital Signs: Reviewed and stable  Last Vitals:  Filed Vitals:   12/25/14 0715  BP: 113/65  Pulse: 73  Temp: 36.4 C  Resp: 20    Complications: No apparent anesthesia complications

## 2014-12-25 NOTE — Addendum Note (Signed)
Addendum  created 12/25/14 1049 by Flossie Dibble, CRNA   Modules edited: Notes Section   Notes Section:  File: [id number]

## 2014-12-25 NOTE — Anesthesia Preprocedure Evaluation (Addendum)
Anesthesia Evaluation  Patient identified by MRN, date of birth, ID band Patient awake    Reviewed: Allergy & Precautions, H&P , NPO status , Patient's Chart, lab work & pertinent test results  Airway Mallampati: I  TM Distance: >3 FB Neck ROM: full    Dental no notable dental hx.    Pulmonary neg pulmonary ROS,    Pulmonary exam normal       Cardiovascular negative cardio ROS      Neuro/Psych negative neurological ROS     GI/Hepatic negative GI ROS, Neg liver ROS,   Endo/Other  negative endocrine ROS  Renal/GU negative Renal ROS     Musculoskeletal   Abdominal Normal abdominal exam  (+)   Peds  Hematology negative hematology ROS (+)   Anesthesia Other Findings   Reproductive/Obstetrics (+) Pregnancy                            Anesthesia Physical Anesthesia Plan  ASA: II  Anesthesia Plan: Epidural   Post-op Pain Management:    Induction:   Airway Management Planned:   Additional Equipment:   Intra-op Plan:   Post-operative Plan:   Informed Consent: I have reviewed the patients History and Physical, chart, labs and discussed the procedure including the risks, benefits and alternatives for the proposed anesthesia with the patient or authorized representative who has indicated his/her understanding and acceptance.     Plan Discussed with:   Anesthesia Plan Comments: (For C/S)       Anesthesia Quick Evaluation

## 2014-12-26 LAB — CBC
HEMATOCRIT: 37 % (ref 36.0–46.0)
Hemoglobin: 12.5 g/dL (ref 12.0–15.0)
MCH: 31.3 pg (ref 26.0–34.0)
MCHC: 33.8 g/dL (ref 30.0–36.0)
MCV: 92.5 fL (ref 78.0–100.0)
PLATELETS: 160 10*3/uL (ref 150–400)
RBC: 4 MIL/uL (ref 3.87–5.11)
RDW: 14 % (ref 11.5–15.5)
WBC: 17.7 10*3/uL — AB (ref 4.0–10.5)

## 2014-12-26 LAB — RPR: RPR: NONREACTIVE

## 2014-12-26 LAB — ABO/RH: ABO/RH(D): O POS

## 2014-12-26 NOTE — Anesthesia Postprocedure Evaluation (Signed)
  Anesthesia Post-op Note  Patient: Desiree Reese  Procedure(s) Performed: Procedure(s): CESAREAN SECTION (N/A)  Patient Location: Mother/Baby  Anesthesia Type:Epidural  Level of Consciousness: awake and alert   Airway and Oxygen Therapy: Patient Spontanous Breathing  Post-op Pain: mild  Post-op Assessment: Post-op Vital signs reviewed, Patient's Cardiovascular Status Stable, Respiratory Function Stable, No signs of Nausea or vomiting, Pain level controlled, No headache, No residual numbness and No residual motor weakness  Post-op Vital Signs: Reviewed  Last Vitals:  Filed Vitals:   12/26/14 0600  BP: 104/65  Pulse: 63  Temp:   Resp: 18    Complications: No apparent anesthesia complications

## 2014-12-26 NOTE — Addendum Note (Signed)
Addendum  created 12/26/14 0917 by Garner Nash, CRNA   Modules edited: Notes Section   Notes Section:  File: [id number]

## 2014-12-26 NOTE — Progress Notes (Addendum)
POD # 1  Subjective: Pt reports feeling well/ Pain controlled with Motrin Tolerating po/ Foley d/c'd and voiding without problems/ No n/v/ Flatus absent Activity: ad lib Bleeding is light Newborn info:  Information for the patient's newborn:  Der, Gagliano [[id number]]  female   Circumcision: planning/ Feeding: breast   Objective:  VS:  Filed Vitals:   12/25/14 1845 12/25/14 2230 12/26/14 0230 12/26/14 0600  BP: 116/72 108/72 100/55 104/65  Pulse: 67 73 65 63  Temp: 98.6 F (37 C) 98.5 F (36.9 C) 98.1 F (36.7 C)   TempSrc: Oral Oral Oral   Resp: 18 20 18 18   Height:      Weight:      SpO2: 98% 98% 95%      I&O: Intake/Output      03/12 0701 - 03/13 0700 03/13 0701 - 03/14 0700   I.V. (mL/kg) 73 (1)    Total Intake(mL/kg) 73 (1)    Urine (mL/kg/hr) 3500 (1.9)    Blood     Total Output 3500     Net -3427             Recent Labs  12/24/14 2145 12/26/14 0610  WBC 13.9* 17.7*  HGB 14.7 12.5  HCT 42.4 37.0  PLT 212 160    Blood type: --/--/O POS (03/11 2145) Rubella: Immune (07/20 0000)    Physical Exam:  General: alert and cooperative CV: Regular rate and rhythm Resp: CTA bilaterally Abdomen: soft, nontender, non-distended, hypoactive bowel sounds Incision: healing well, no drainage, no erythema, no hernia, no seroma, no swelling, well approximated with suture. Uterine Fundus: firm, below umbilicus, nontender Lochia: minimal Ext: edema Trace BLE  and Homans sign is negative, no sign of DVT    Assessment: POD # 1/ G2P2001/ S/P C/Section d/t arrest of descent/labor, failed TOLAC Doing well  Plan: Ambulate 3-4 times in hall today Continue routine post op orders Interested in early discharge tomorrow   Signed: Graciela Husbands, MSN, CNM 12/26/2014, 11:42 AM

## 2014-12-27 MED ORDER — OXYCODONE-ACETAMINOPHEN 5-325 MG PO TABS: 1.0000 | ORAL_TABLET | ORAL | Status: DC | PRN

## 2014-12-27 MED ORDER — IBUPROFEN 600 MG PO TABS: 600.0000 mg | ORAL_TABLET | Freq: Four times a day (QID) | ORAL | Status: DC

## 2014-12-27 NOTE — Progress Notes (Signed)
POSTOPERATIVE DAY # 2 S/P C/Section d/t arrest of descent/labor, failed TOLAC   S:         Reports feeling good, ready to be discharged home              Tolerating po intake / no nausea / no vomiting / + flatus / no BM             Bleeding is light             Pain controlled with Motrin and Percocet             Up ad lib / ambulatory/ voiding QS  Newborn breast feeding  - going well, latching well / Circumcision - done    O:  VS: BP 109/66 mmHg  Pulse 62  Temp(Src) 97.7 F (36.5 C) (Oral)  Resp 18  Ht 5\' 4"  (1.626 m)  Wt 75.751 kg (167 lb)  BMI 28.65 kg/m2  SpO2 95%  Breastfeeding? Unknown   LABS:               Recent Labs  12/24/14 2145 12/26/14 0610  WBC 13.9* 17.7*  HGB 14.7 12.5  PLT 212 160               Bloodtype: --/--/O POS (03/11 2145)  Rubella: Immune (07/20 0000)                                                       Physical Exam:             Alert and Oriented X3  Lungs: Clear and unlabored  Heart: regular rate and rhythm / no mumurs  Abdomen: soft, non-tender, non-distended, + active bowel sounds in all 4 quadrants              Fundus: firm, non-tender, U-2             Dressing: honeycomb dressing clean / dry / intact              Incision:  approximated with sutures / no erythema / no ecchymosis / no drainage  Perineum: intact  Lochia: light  Extremities: trace dependent edema, no calf pain or tenderness, negative Homans  A:        POD # 2 S/P C/Section d/t arrest of descent/labor, failed TOLAC            Doing well - stable  P:        Discharge home today - [facility] discharge book given              Remove honeycomb dressing Day 4 (Wednesday, 3/16) - keep clean, dry  Reviewed signs/symptoms to call   F/U in 6 weeks PP visit with Dr. Gae Bon, [facility]

## 2014-12-27 NOTE — Discharge Summary (Signed)
POSTOPERATIVE DISCHARGE SUMMARY:  Patient ID: Desiree Reese MRN: [id number] DOB/AGE: [birth year] 37 y.o.  Admit date: 12/24/2014 Admission Diagnoses: 41.5 weeks / active labor / previous cesarean section - desires TOLAC  Discharge date:  12/27/2014 Discharge Diagnoses: POD 2 s/p cesarean section - failed TOLAC with arrest of active labor and descent  Prenatal history: G2P2001   EDC : 12/13/2014, Alternate EDD Entry  Prenatal care at [facility]  Primary provider : Pamala Hurry Prenatal course complicated by previous cesarean section  Prenatal Labs: ABO, Rh: --/--/O POS (03/11 2145)  Antibody: Negative (07/20 0000) Rubella: Immune (07/20 0000)   RPR: Non Reactive (03/11 2145)  HBsAg: Negative (07/20 0000)  HIV: Non-reactive (07/20 0000)  GTT : Normal GBS: Negative (01/28 0000)   Medical / Surgical History :  Past medical history:  Past Medical History  Diagnosis Date  . Depression   . Anxiety   . Fibroid   . Active labor 12/24/2014  . Post-term pregnancy, 40-42 weeks of gestation 12/24/2014  . Active labor 12/24/2014    Past surgical history:  Past Surgical History  Procedure Laterality Date  . Cesarean section    . Tonsillectomy      Family History: History reviewed. No pertinent family history.  Social History:  reports that she has never smoked. She has never used smokeless tobacco. She reports that she drinks alcohol. She reports that she does not use illicit drugs.  Allergies: Review of patient's allergies indicates no known allergies.   Current Medications at time of admission:  Prior to Admission medications   Medication Sig Start Date End Date Taking? Authorizing Provider  calcium carbonate (TUMS - DOSED IN MG ELEMENTAL CALCIUM) 500 MG chewable tablet Chew 2 tablets by mouth daily as needed for indigestion or heartburn.   Yes Historical Provider, MD  Prenatal Vit-Fe Fumarate-FA (PRENATAL MULTIVITAMIN) TABS tablet Take 1 tablet by mouth daily.     Yes Historical Provider, MD   Intrapartum Course:  Admit for active labor with labor progression to 9cm dilation with protracted active labor curve Pain management: epidural  Complicated by: arrest of active labor / descent Interventions required: cesarean section  Procedures: Cesarean section delivery on 12/25/2014 with delivery of  female newborn by Dr Pamala Hurry   See operative report for further details APGAR (1 MIN): 8   APGAR (5 MINS): 9    Postoperative / postpartum course:  Uncomplicated with discharge on POD 2  Discharge Instructions:  Discharged Condition: stable  Activity: pelvic rest and postoperative restrictions x 2   Diet: routine  Medications:    Medication List    STOP taking these medications        calcium carbonate 500 MG chewable tablet  Commonly known as:  TUMS - dosed in mg elemental calcium      TAKE these medications        ibuprofen 600 MG tablet  Commonly known as:  ADVIL,MOTRIN  Take 1 tablet (600 mg total) by mouth every 6 (six) hours.     oxyCODONE-acetaminophen 5-325 MG per tablet  Commonly known as:  PERCOCET/ROXICET  Take 1 tablet by mouth every 4 (four) hours as needed (for pain scale less than 7).     prenatal multivitamin Tabs tablet  Take 1 tablet by mouth daily.        Wound Care: keep clean and dry / remove honeycomb POD 4 Postpartum Instructions: [facility] discharge booklet - instructions reviewed  Discharge to: Home  Follow up :   [facility]  in 6 weeks for routine postpartum visit with Dr Pamala Hurry                Signed: Artelia Laroche CNM, MSN, [facility] 12/27/2014, 9:19 AM

## 2014-12-27 NOTE — Lactation Note (Signed)
This note was copied from the chart of [name]. Lactation Consultation Note  Follow up visit made prior to discharge.  Mom states baby has been latching easily but not always deep.  Baby is currently latched to left breast and positioned in cradle hold.  Baby is sucking actively but latch is shallow.  Mom denies any breast tenderness.  Mom took baby off and nipple was round and elongated.  Assisted mom with cross cradle hold but baby too fussy to latch.  We changed position to football hold.  Mom can easily hand express transitional milk prior to latch.  After a few attempts and good breast compression baby opened wide and latched deep.  Baby sleepy at the breast.  Reviewed waking techniques and breast massage and compression for improved milk flow.  Reviewed keeping a feeding diary the first week.  Due to mom's history of low milk supply with her daughter i recommended post pumping after every other breastfeeding.  Mom given a dropper and curved tip syringe with directions on use.  Instructed to give any expressed milk back to baby.  Mom is feeling anxious because of her past experience.  Support given and an outpatient lactation appointment scheduled for Friday 12/31/14 1030.  Encouraged to call office sooner if concerns arise.  Patient Name: Desiree Reese OEHOZ'Y Date: 12/27/2014 Reason for consult: Follow-up assessment;MD order   Maternal Data    Feeding Feeding Type: Breast Fed Length of feed: 10 min  LATCH Score/Interventions Latch: Grasps breast easily, tongue down, lips flanged, rhythmical sucking.  Audible Swallowing: A few with stimulation Intervention(s): Skin to skin;Hand expression;Alternate breast massage  Type of Nipple: Everted at rest and after stimulation  Comfort (Breast/Nipple): Soft / non-tender     Hold (Positioning): Assistance needed to correctly position infant at breast and maintain latch. Intervention(s): Breastfeeding basics reviewed;Support  Pillows;Position options;Skin to skin  LATCH Score: 8  Lactation Tools Discussed/Used     Consult Status Consult Status: Complete    Micheal Sheen S 12/27/2014, 10:10 AM

## 2014-12-28 ENCOUNTER — Encounter (HOSPITAL_COMMUNITY): Payer: Self-pay | Admitting: Obstetrics

## 2014-12-31 ENCOUNTER — Ambulatory Visit (HOSPITAL_COMMUNITY): Admit: 2014-12-31 | Payer: BLUE CROSS/BLUE SHIELD

## 2017-01-14 DIAGNOSIS — S63592A Other specified sprain of left wrist, initial encounter: Secondary | ICD-10-CM | POA: Diagnosis not present

## 2017-03-18 DIAGNOSIS — Z01419 Encounter for gynecological examination (general) (routine) without abnormal findings: Secondary | ICD-10-CM | POA: Diagnosis not present

## 2017-03-18 DIAGNOSIS — Z6824 Body mass index (BMI) 24.0-24.9, adult: Secondary | ICD-10-CM | POA: Diagnosis not present

## 2017-03-18 DIAGNOSIS — Z1151 Encounter for screening for human papillomavirus (HPV): Secondary | ICD-10-CM | POA: Diagnosis not present

## 2017-09-06 DIAGNOSIS — R928 Other abnormal and inconclusive findings on diagnostic imaging of breast: Secondary | ICD-10-CM | POA: Diagnosis not present

## 2017-09-06 DIAGNOSIS — R922 Inconclusive mammogram: Secondary | ICD-10-CM | POA: Diagnosis not present

## 2017-09-06 DIAGNOSIS — R591 Generalized enlarged lymph nodes: Secondary | ICD-10-CM | POA: Diagnosis not present

## 2017-09-06 DIAGNOSIS — N6321 Unspecified lump in the left breast, upper outer quadrant: Secondary | ICD-10-CM | POA: Diagnosis not present

## 2017-11-25 DIAGNOSIS — J029 Acute pharyngitis, unspecified: Secondary | ICD-10-CM | POA: Diagnosis not present

## 2017-11-25 DIAGNOSIS — J101 Influenza due to other identified influenza virus with other respiratory manifestations: Secondary | ICD-10-CM | POA: Diagnosis not present

## 2019-02-23 DIAGNOSIS — R21 Rash and other nonspecific skin eruption: Secondary | ICD-10-CM | POA: Diagnosis not present

## 2019-02-23 DIAGNOSIS — W57XXXA Bitten or stung by nonvenomous insect and other nonvenomous arthropods, initial encounter: Secondary | ICD-10-CM | POA: Diagnosis not present

## 2019-09-02 DIAGNOSIS — F900 Attention-deficit hyperactivity disorder, predominantly inattentive type: Secondary | ICD-10-CM | POA: Diagnosis not present

## 2019-09-02 DIAGNOSIS — F411 Generalized anxiety disorder: Secondary | ICD-10-CM | POA: Diagnosis not present

## 2020-06-13 ENCOUNTER — Telehealth: Payer: Self-pay | Admitting: General Practice

## 2020-06-13 NOTE — Telephone Encounter (Signed)
ok 

## 2020-06-13 NOTE — Telephone Encounter (Signed)
Please advise 

## 2020-06-13 NOTE — Telephone Encounter (Signed)
Patient states her spouse sees you and she would like to establish care as well.   Please Advise

## 2020-06-14 NOTE — Telephone Encounter (Signed)
Okay to schedule NP appt w/ Dr. Etter Sjogren. Thank you.

## 2020-06-30 ENCOUNTER — Ambulatory Visit: Payer: 59 | Admitting: Family Medicine

## 2020-06-30 ENCOUNTER — Other Ambulatory Visit: Payer: Self-pay

## 2020-06-30 ENCOUNTER — Encounter: Payer: Self-pay | Admitting: Family Medicine

## 2020-06-30 ENCOUNTER — Other Ambulatory Visit (HOSPITAL_BASED_OUTPATIENT_CLINIC_OR_DEPARTMENT_OTHER): Payer: Self-pay | Admitting: Family Medicine

## 2020-06-30 VITALS — BP 110/78 | HR 82 | Temp 98.1°F | Resp 18 | Ht 64.0 in | Wt 163.0 lb

## 2020-06-30 DIAGNOSIS — F988 Other specified behavioral and emotional disorders with onset usually occurring in childhood and adolescence: Secondary | ICD-10-CM | POA: Insufficient documentation

## 2020-06-30 DIAGNOSIS — L409 Psoriasis, unspecified: Secondary | ICD-10-CM | POA: Diagnosis not present

## 2020-06-30 DIAGNOSIS — Z23 Encounter for immunization: Secondary | ICD-10-CM | POA: Diagnosis not present

## 2020-06-30 DIAGNOSIS — Z1231 Encounter for screening mammogram for malignant neoplasm of breast: Secondary | ICD-10-CM

## 2020-06-30 DIAGNOSIS — F418 Other specified anxiety disorders: Secondary | ICD-10-CM | POA: Diagnosis not present

## 2020-06-30 MED ORDER — CLOBETASOL PROPIONATE 0.05 % EX FOAM: Freq: Two times a day (BID) | CUTANEOUS | 2 refills | Status: DC

## 2020-06-30 MED ORDER — BUPROPION HCL ER (XL) 150 MG PO TB24: 150.0000 mg | ORAL_TABLET | Freq: Every day | ORAL | 0 refills | Status: DC

## 2020-06-30 NOTE — Patient Instructions (Signed)

## 2020-06-30 NOTE — Progress Notes (Signed)
Patient ID: Desiree Reese, female    DOB: 03-28-1976  Age: 44 y.o. MRN: [id number]    Subjective:  Subjective  HPI Desiree Reese presents to establish and discuss her psoriasis.  No other compliaints   Review of Systems  Constitutional: Negative for appetite change, diaphoresis, fatigue and unexpected weight change.  Eyes: Negative for pain, redness and visual disturbance.  Respiratory: Negative for cough, chest tightness, shortness of breath and wheezing.   Cardiovascular: Negative for chest pain, palpitations and leg swelling.  Endocrine: Negative for cold intolerance, heat intolerance, polydipsia, polyphagia and polyuria.  Genitourinary: Negative for difficulty urinating, dysuria and frequency.  Skin: Positive for rash. Negative for color change.  Neurological: Negative for dizziness, light-headedness, numbness and headaches.    History Past Medical History:  Diagnosis Date  . Active labor 12/24/2014  . Active labor 12/24/2014  . Anxiety   . Depression   . Fibroid   . Post-term pregnancy, 40-42 weeks of gestation 12/24/2014    She has a past surgical history that includes Cesarean section; Tonsillectomy; and Cesarean section (N/A, 12/25/2014).   Her family history is not on file.She reports that she has never smoked. She has never used smokeless tobacco. She reports current alcohol use. She reports that she does not use drugs.  Current Outpatient Medications on File Prior to Visit  Medication Sig Dispense Refill  . dexmethylphenidate (FOCALIN XR) 20 MG 24 hr capsule      No current facility-administered medications on file prior to visit.     Objective:  Objective  Physical Exam Vitals and nursing note reviewed.  Constitutional:      Appearance: She is well-developed.  HENT:     Head: Normocephalic and atraumatic.  Eyes:     Conjunctiva/sclera: Conjunctivae normal.  Neck:     Thyroid: No thyromegaly.     Vascular: No carotid bruit or JVD.  Cardiovascular:      Rate and Rhythm: Normal rate and regular rhythm.     Heart sounds: Normal heart sounds. No murmur heard.   Pulmonary:     Effort: Pulmonary effort is normal. No respiratory distress.     Breath sounds: Normal breath sounds. No wheezing or rales.  Chest:     Chest wall: No tenderness.  Musculoskeletal:     Cervical back: Normal range of motion and neck supple.  Skin:    Findings: Rash present. No erythema.  Neurological:     Mental Status: She is alert and oriented to person, place, and time.    BP 110/78 (BP Location: Right Arm, Patient Position: Sitting, Cuff Size: Normal)   Pulse 82   Temp 98.1 F (36.7 C) (Oral)   Resp 18   Ht 5\' 4"  (1.626 m)   Wt 163 lb (73.9 kg)   LMP 06/06/2020   SpO2 99%   BMI 27.98 kg/m  Wt Readings from Last 3 Encounters:  06/30/20 163 lb (73.9 kg)  12/24/14 167 lb (75.8 kg)  12/15/14 167 lb (75.8 kg)     Lab Results  Component Value Date   WBC 17.7 (H) 12/26/2014   HGB 12.5 12/26/2014   HCT 37.0 12/26/2014   PLT 160 12/26/2014    No results found.   Assessment & Plan:  Plan  I have discontinued Aara H. Kinne's prenatal multivitamin, ibuprofen, and oxyCODONE-acetaminophen. I am also having her start on buPROPion. Additionally, I am having her maintain her dexmethylphenidate and clobetasol.  Meds ordered this encounter  Medications  . DISCONTD: clobetasol (OLUX)  0.05 % topical foam    Sig: Apply topically 2 (two) times daily.    Dispense:  50 g    Refill:  2  . clobetasol (OLUX) 0.05 % topical foam    Sig: Apply topically 2 (two) times daily.    Dispense:  50 g    Refill:  2  . buPROPion (WELLBUTRIN XL) 150 MG 24 hr tablet    Sig: Take 1 tablet (150 mg total) by mouth daily.    Dispense:  60 tablet    Refill:  0    Problem List Items Addressed This Visit      Unprioritized   Attention deficit disorder (ADD) in adult    Pt given name and number of psych to call for eval       Relevant Medications   buPROPion  (WELLBUTRIN XL) 150 MG 24 hr tablet   Depression with anxiety    wellbutrin started ----pt has had it before  F/u 1 month       Relevant Medications   buPROPion (WELLBUTRIN XL) 150 MG 24 hr tablet   Psoriasis - Primary    Refill olux      Relevant Medications   clobetasol (OLUX) 0.05 % topical foam    Other Visit Diagnoses    Need for influenza vaccination       Relevant Orders   Flu Vaccine QUAD 36+ mos IM (Completed)      Follow-up: Return in about 4 weeks (around 07/28/2020), or if symptoms worsen or fail to improve, for anxiety dep----  pt also needs cpe scheduled .  Desiree Held, DO

## 2020-07-01 ENCOUNTER — Encounter: Payer: Self-pay | Admitting: Family Medicine

## 2020-07-01 DIAGNOSIS — F418 Other specified anxiety disorders: Secondary | ICD-10-CM | POA: Insufficient documentation

## 2020-07-01 NOTE — Assessment & Plan Note (Signed)
Refill olux

## 2020-07-01 NOTE — Assessment & Plan Note (Signed)
Pt given name and number of psych to call for eval

## 2020-07-01 NOTE — Assessment & Plan Note (Signed)
wellbutrin started ----pt has had it before  F/u 1 month

## 2020-07-07 ENCOUNTER — Ambulatory Visit (HOSPITAL_BASED_OUTPATIENT_CLINIC_OR_DEPARTMENT_OTHER)
Admission: RE | Admit: 2020-07-07 | Discharge: 2020-07-07 | Disposition: A | Discharge status: Home / Self Care | Payer: 59 | Source: Ambulatory Visit | Attending: Family Medicine | Admitting: Family Medicine

## 2020-07-07 ENCOUNTER — Other Ambulatory Visit: Payer: Self-pay

## 2020-07-07 DIAGNOSIS — Z1231 Encounter for screening mammogram for malignant neoplasm of breast: Secondary | ICD-10-CM | POA: Insufficient documentation

## 2020-07-08 ENCOUNTER — Encounter: Payer: Self-pay | Admitting: Family Medicine

## 2020-07-08 DIAGNOSIS — F988 Other specified behavioral and emotional disorders with onset usually occurring in childhood and adolescence: Secondary | ICD-10-CM

## 2020-07-29 ENCOUNTER — Telehealth: Payer: 59 | Admitting: Family Medicine

## 2020-08-18 ENCOUNTER — Encounter: Payer: 59 | Admitting: Family Medicine

## 2020-09-13 MED ORDER — BUPROPION HCL ER (XL) 150 MG PO TB24: 150.0000 mg | ORAL_TABLET | Freq: Every day | ORAL | 0 refills | Status: DC

## 2020-09-13 NOTE — Telephone Encounter (Signed)
Ok to refill x1 month and we will see her in Jan

## 2020-09-23 ENCOUNTER — Telehealth: Payer: 59 | Admitting: Family Medicine

## 2020-10-20 ENCOUNTER — Telehealth: Payer: Self-pay

## 2020-10-20 ENCOUNTER — Other Ambulatory Visit: Payer: Self-pay

## 2020-10-20 ENCOUNTER — Ambulatory Visit (INDEPENDENT_AMBULATORY_CARE_PROVIDER_SITE_OTHER): Payer: 59 | Admitting: Family Medicine

## 2020-10-20 VITALS — BP 122/84 | HR 111 | Temp 98.2°F | Ht 64.0 in | Wt 162.0 lb

## 2020-10-20 DIAGNOSIS — Z0000 Encounter for general adult medical examination without abnormal findings: Secondary | ICD-10-CM

## 2020-10-20 DIAGNOSIS — N644 Mastodynia: Secondary | ICD-10-CM | POA: Diagnosis not present

## 2020-10-20 NOTE — Progress Notes (Signed)
Subjective:     Desiree Reese is a 45 y.o. female and is here for a comprehensive physical exam. The patient reports no problems.  Social History   Socioeconomic History   Marital status: Married    Spouse name: Not on file   Number of children: Not on file   Years of education: Not on file   Highest education level: Not on file  Occupational History   Not on file  Tobacco Use   Smoking status: Never Smoker   Smokeless tobacco: Never Used  Substance and Sexual Activity   Alcohol use: Yes    Comment: occas. prior to preg.   Drug use: No   Sexual activity: Yes  Other Topics Concern   Not on file  Social History Narrative   Not on file   Social Determinants of Health   Financial Resource Strain: Not on file  Food Insecurity: Not on file  Transportation Needs: Not on file  Physical Activity: Not on file  Stress: Not on file  Social Connections: Not on file  Intimate Partner Violence: Not on file   Health Maintenance  Topic Date Due   Hepatitis C Screening  Never done   TETANUS/TDAP  Never done   PAP SMEAR-Modifier  Never done   INFLUENZA VACCINE  Completed   COVID-19 Vaccine  Completed   HIV Screening  Completed    The following portions of the patient's history were reviewed and updated as appropriate:  She  has a past medical history of Active labor (12/24/2014), Active labor (12/24/2014), Anxiety, Depression, Fibroid, and Post-term pregnancy, 40-42 weeks of gestation (12/24/2014). She does not have any pertinent problems on file. She  has a past surgical history that includes Cesarean section; Tonsillectomy; and Cesarean section (N/A, 12/25/2014). Her family history is not on file. She  reports that she has never smoked. She has never used smokeless tobacco. She reports current alcohol use. She reports that she does not use drugs. She has a current medication list which includes the following prescription(s): amphetamine-dextroamphetamine,  clobetasol, bupropion, and dexmethylphenidate. Current Outpatient Medications on File Prior to Visit  Medication Sig Dispense Refill   amphetamine-dextroamphetamine (ADDERALL) 20 MG tablet Take 20 mg by mouth 3 (three) times daily.     clobetasol (OLUX) 0.05 % topical foam Apply topically 2 (two) times daily. 50 g 2   dexmethylphenidate (FOCALIN XR) 20 MG 24 hr capsule  (Patient not taking: Reported on 10/20/2020)     No current facility-administered medications on file prior to visit.   She has No Known Allergies..  Review of Systems Review of Systems  Constitutional: Negative for activity change, appetite change and fatigue.  HENT: Negative for hearing loss, congestion, tinnitus and ear discharge.  dentist q7m Eyes: Negative for visual disturbance (see optho q1y -- vision corrected to 20/20 with glasses).  Respiratory: Negative for cough, chest tightness and shortness of breath.   Cardiovascular: Negative for chest pain, palpitations and leg swelling.  Gastrointestinal: Negative for abdominal pain, diarrhea, constipation and abdominal distention.  Genitourinary: Negative for urgency, frequency, decreased urine volume and difficulty urinating.  Musculoskeletal: Negative for back pain, arthralgias and gait problem.  Skin: Negative for color change, pallor and rash.  Neurological: Negative for dizziness, light-headedness, numbness and headaches.  Hematological: Negative for adenopathy. Does not bruise/bleed easily.  Psychiatric/Behavioral: Negative for suicidal ideas, confusion, sleep disturbance, self-injury, dysphoric mood, decreased concentration and agitation.       Objective:    BP 122/84 (BP Location: Left Arm,  Patient Position: Sitting)    Pulse (!) 111    Temp 98.2 F (36.8 C) (Oral)    Ht 5\' 4"  (1.626 m)    Wt 162 lb (73.5 kg)    SpO2 99%    BMI 27.81 kg/m  General appearance: alert, cooperative, appears stated age and no distress Head: Normocephalic, without obvious  abnormality, atraumatic Eyes: negative findings: lids and lashes normal, conjunctivae and sclerae normal and pupils equal, round, reactive to light and accomodation Ears: normal TM's and external ear canals both ears Neck: no adenopathy, no carotid bruit, no JVD, supple, symmetrical, trachea midline and thyroid not enlarged, symmetric, no tenderness/mass/nodules Back: symmetric, no curvature. ROM normal. No CVA tenderness. Lungs: clear to auscultation bilaterally Breasts: breast exam normal-- no dimpling, no masses palpated,  Tenderness R breast , no axillary nodes  Heart: regular rate and rhythm, S1, S2 normal, no murmur, click, rub or gallop Abdomen: soft, non-tender; bowel sounds normal; no masses,  no organomegaly Pelvic: deferred --gyn Extremities: extremities normal, atraumatic, no cyanosis or edema Pulses: 2+ and symmetric Skin: Skin color, texture, turgor normal. No rashes or lesions Lymph nodes: Cervical, supraclavicular, and axillary nodes normal. Neurologic: Alert and oriented X 3, normal strength and tone. Normal symmetric reflexes. Normal coordination and gait    Assessment:    Healthy female exam.      Plan:    ghm utd Check labs  See After Visit Summary for Counseling Recommendations    1. Preventative health care rto for pap-- pt is on her period now  - Lipid panel - CBC with Differential/Platelet - TSH - Comprehensive metabolic panel  2. Breast pain, right Diagnostic mammogram ordered  - MM Digital Diagnostic Bilat; Future - BREAST COMPLETE UNI RIGHT INC AXILLA; Future   \

## 2020-10-20 NOTE — Patient Instructions (Signed)

## 2020-10-20 NOTE — Telephone Encounter (Signed)
Patient forgot to talk about the wellbutrin that she is taking, she wants to know if she will continue it since she was to try it for two months? She states that she is doing well on it and has not had any issues. Please advise.

## 2020-10-20 NOTE — Telephone Encounter (Signed)
Ok to send in wellbutrin xl 300 mg 1 po qd #90 3 refills

## 2020-10-21 ENCOUNTER — Other Ambulatory Visit: Payer: Self-pay

## 2020-10-21 LAB — CBC WITH DIFFERENTIAL/PLATELET
Basophils Absolute: 0.1 10*3/uL (ref 0.0–0.1)
Basophils Relative: 0.6 % (ref 0.0–3.0)
Eosinophils Absolute: 0.1 10*3/uL (ref 0.0–0.7)
Eosinophils Relative: 0.9 % (ref 0.0–5.0)
HCT: 40.9 % (ref 36.0–46.0)
Hemoglobin: 13.9 g/dL (ref 12.0–15.0)
Lymphocytes Relative: 17.4 % (ref 12.0–46.0)
Lymphs Abs: 1.6 10*3/uL (ref 0.7–4.0)
MCHC: 34 g/dL (ref 30.0–36.0)
MCV: 91.2 fl (ref 78.0–100.0)
Monocytes Absolute: 0.6 10*3/uL (ref 0.1–1.0)
Monocytes Relative: 7 % (ref 3.0–12.0)
Neutro Abs: 6.9 10*3/uL (ref 1.4–7.7)
Neutrophils Relative %: 74.1 % (ref 43.0–77.0)
Platelets: 286 10*3/uL (ref 150.0–400.0)
RBC: 4.49 Mil/uL (ref 3.87–5.11)
RDW: 13 % (ref 11.5–15.5)
WBC: 9.3 10*3/uL (ref 4.0–10.5)

## 2020-10-21 LAB — LIPID PANEL
Cholesterol: 197 mg/dL (ref 0–200)
HDL: 74.9 mg/dL (ref >39.00)
LDL Cholesterol: 105 mg/dL — ABNORMAL HIGH (ref 0–99)
NonHDL: 122.01
Total CHOL/HDL Ratio: 3
Triglycerides: 84 mg/dL (ref 0.0–149.0)
VLDL: 16.8 mg/dL (ref 0.0–40.0)

## 2020-10-21 LAB — COMPREHENSIVE METABOLIC PANEL
ALT: 10 U/L (ref 0–35)
AST: 11 U/L (ref 0–37)
Albumin: 4.5 g/dL (ref 3.5–5.2)
Alkaline Phosphatase: 77 U/L (ref 39–117)
BUN: 16 mg/dL (ref 6–23)
CO2: 30 mEq/L (ref 19–32)
Calcium: 9.6 mg/dL (ref 8.4–10.5)
Chloride: 104 mEq/L (ref 96–112)
Creatinine, Ser: 0.8 mg/dL (ref 0.40–1.20)
GFR: 90.55 mL/min (ref >60.00)
Glucose, Bld: 100 mg/dL — ABNORMAL HIGH (ref 70–99)
Potassium: 3.7 mEq/L (ref 3.5–5.1)
Sodium: 140 mEq/L (ref 135–145)
Total Bilirubin: 0.4 mg/dL (ref 0.2–1.2)
Total Protein: 6.9 g/dL (ref 6.0–8.3)

## 2020-10-21 LAB — TSH: TSH: 3.78 u[IU]/mL (ref 0.35–4.50)

## 2020-10-21 MED ORDER — BUPROPION HCL ER (XL) 300 MG PO TB24: 300.0000 mg | ORAL_TABLET | Freq: Every day | ORAL | 3 refills | Status: DC

## 2020-10-21 NOTE — Telephone Encounter (Signed)
RX sent in and pt advised- JMA

## 2020-10-23 ENCOUNTER — Encounter: Payer: Self-pay | Admitting: Family Medicine

## 2020-10-25 ENCOUNTER — Ambulatory Visit: Payer: 59 | Admitting: Family Medicine

## 2020-10-27 ENCOUNTER — Encounter: Payer: Self-pay | Admitting: Family Medicine

## 2020-11-08 ENCOUNTER — Other Ambulatory Visit (HOSPITAL_BASED_OUTPATIENT_CLINIC_OR_DEPARTMENT_OTHER): Payer: Self-pay | Admitting: Internal Medicine

## 2020-11-08 ENCOUNTER — Ambulatory Visit: Payer: 59 | Attending: Internal Medicine

## 2020-11-08 DIAGNOSIS — Z23 Encounter for immunization: Secondary | ICD-10-CM

## 2020-11-08 MED FILL — PFIZER-BIONTECH COVID-19 VA: 30 | 21 days supply | Qty: 0 | Fill #0

## 2020-11-08 NOTE — Progress Notes (Signed)
   Covid-19 Vaccination Clinic  Name:  Desiree Reese    MRN: [id number] DOB: 1975-12-27  11/08/2020  Ms. Shilling was observed post Covid-19 immunization for 15 minutes without incident. She was provided with Vaccine Information Sheet and instruction to access the V-Safe system.  Vaccinated by [facility]  Ms. Zenk was instructed to call 911 with any severe reactions post vaccine: Marland Kitchen Difficulty breathing  . Swelling of face and throat  . A fast heartbeat  . A bad rash all over body  . Dizziness and weakness   Immunizations Administered    Name Date Dose VIS Date Route   Pfizer COVID-19 Vaccine 11/08/2020 10:26 AM 0.3 mL 08/03/2020 Intramuscular   Manufacturer: [place]   Lot: [id number]   NDC: [address]-7

## 2020-11-24 ENCOUNTER — Encounter: Payer: Self-pay | Admitting: Family Medicine

## 2020-11-24 ENCOUNTER — Telehealth: Payer: Self-pay

## 2020-11-24 NOTE — Telephone Encounter (Signed)
Pt has not tried any other prescription medication for this. PA sent. Awaiting determination.

## 2020-11-24 NOTE — Telephone Encounter (Signed)
PA initiated via Covermymeds; KEY: [id number]. Awaiting response from Pt regarding other therapy for psoriasis.

## 2020-11-25 ENCOUNTER — Other Ambulatory Visit: Payer: Self-pay | Admitting: Family Medicine

## 2020-11-25 MED ORDER — CLOBETASOL PROPIONATE 0.05 % EX SOLN: 1.0000 "application " | Freq: Two times a day (BID) | CUTANEOUS | 0 refills | Status: DC

## 2020-11-25 NOTE — Telephone Encounter (Signed)
Pt has not tried formulary alternatives- clobetasol topical solution, calcipotriene 0.005% scalp solution, or fluocinolone acetonide 0.01% scalp oil.

## 2020-11-25 NOTE — Telephone Encounter (Signed)
I sent in clobetasol solution

## 2020-12-06 ENCOUNTER — Other Ambulatory Visit: Payer: Self-pay | Admitting: Family Medicine

## 2020-12-06 ENCOUNTER — Other Ambulatory Visit: Payer: Self-pay

## 2020-12-06 ENCOUNTER — Ambulatory Visit
Admission: RE | Admit: 2020-12-06 | Discharge: 2020-12-06 | Disposition: A | Discharge status: Home / Self Care | Payer: 59 | Source: Ambulatory Visit | Attending: Family Medicine | Admitting: Family Medicine

## 2020-12-06 DIAGNOSIS — N644 Mastodynia: Secondary | ICD-10-CM

## 2020-12-08 ENCOUNTER — Ambulatory Visit: Payer: 59 | Admitting: Family Medicine

## 2020-12-14 ENCOUNTER — Encounter: Payer: Self-pay | Admitting: Family Medicine

## 2020-12-29 ENCOUNTER — Ambulatory Visit: Payer: 59 | Admitting: Family Medicine

## 2021-01-05 ENCOUNTER — Ambulatory Visit: Payer: 59 | Admitting: Family Medicine

## 2021-01-17 ENCOUNTER — Other Ambulatory Visit (HOSPITAL_COMMUNITY)
Admission: RE | Admit: 2021-01-17 | Discharge: 2021-01-17 | Disposition: A | Discharge status: Home / Self Care | Payer: 59 | Source: Ambulatory Visit | Attending: Family Medicine | Admitting: Family Medicine

## 2021-01-17 ENCOUNTER — Ambulatory Visit (INDEPENDENT_AMBULATORY_CARE_PROVIDER_SITE_OTHER): Payer: 59 | Admitting: Family Medicine

## 2021-01-17 ENCOUNTER — Encounter: Payer: Self-pay | Admitting: Family Medicine

## 2021-01-17 ENCOUNTER — Other Ambulatory Visit: Payer: Self-pay

## 2021-01-17 VITALS — BP 118/84 | HR 111 | Temp 98.7°F | Resp 18 | Ht 64.0 in | Wt 164.6 lb

## 2021-01-17 DIAGNOSIS — Z0000 Encounter for general adult medical examination without abnormal findings: Secondary | ICD-10-CM | POA: Diagnosis present

## 2021-01-17 DIAGNOSIS — Z01419 Encounter for gynecological examination (general) (routine) without abnormal findings: Secondary | ICD-10-CM | POA: Insufficient documentation

## 2021-01-17 NOTE — Progress Notes (Signed)
Subjective:    Patient ID: Desiree Reese, female    DOB: 06-11-1976, 45 y.o.   MRN: [id number]  Chief Complaint  Patient presents with  . Gynecologic Exam    Pt reports no concerns    HPI Patient is in today for  Pap only .  No complaints       Past Medical History:  Diagnosis Date  . Active labor 12/24/2014  . Active labor 12/24/2014  . Anxiety   . Depression   . Fibroid   . Post-term pregnancy, 40-42 weeks of gestation 12/24/2014    Past Surgical History:  Procedure Laterality Date  . CESAREAN SECTION    . CESAREAN SECTION N/A 12/25/2014   Procedure: CESAREAN SECTION;  Surgeon: Aloha Gell, MD;  Location: [facility];  Service: Obstetrics;  Laterality: N/A;  . TONSILLECTOMY      History reviewed. No pertinent family history.  Social History   Socioeconomic History  . Marital status: Married    Spouse name: Not on file  . Number of children: Not on file  . Years of education: Not on file  . Highest education level: Not on file  Occupational History  . Not on file  Tobacco Use  . Smoking status: Never Smoker  . Smokeless tobacco: Never Used  Substance and Sexual Activity  . Alcohol use: Yes    Comment: occas. prior to preg.  . Drug use: No  . Sexual activity: Yes  Other Topics Concern  . Not on file  Social History Narrative  . Not on file   Social Determinants of Health   Financial Resource Strain: Not on file  Food Insecurity: Not on file  Transportation Needs: Not on file  Physical Activity: Not on file  Stress: Not on file  Social Connections: Not on file  Intimate Partner Violence: Not on file    Outpatient Medications Prior to Visit  Medication Sig Dispense Refill  . amphetamine-dextroamphetamine (ADDERALL) 20 MG tablet Take 20 mg by mouth 3 (three) times daily.    Marland Kitchen buPROPion (WELLBUTRIN XL) 300 MG 24 hr tablet Take 1 tablet (300 mg total) by mouth daily. 90 tablet 3  . clobetasol (TEMOVATE) 0.05 % external solution Apply 1  application topically 2 (two) times daily. 50 mL 0  . COVID-19 mRNA vaccine, Pfizer, 30 MCG/0.3ML injection INJECT AS DIRECTED (Patient not taking: Reported on 01/17/2021) .3 mL 0  . dexmethylphenidate (FOCALIN XR) 20 MG 24 hr capsule  (Patient not taking: No sig reported)     No facility-administered medications prior to visit.    No Known Allergies  Review of Systems  Constitutional: Negative for chills, fever and malaise/fatigue.  HENT: Negative for congestion and hearing loss.   Eyes: Negative for discharge.  Respiratory: Negative for cough, sputum production and shortness of breath.   Cardiovascular: Negative for chest pain, palpitations and leg swelling.  Gastrointestinal: Negative for abdominal pain, blood in stool, constipation, diarrhea, heartburn, nausea and vomiting.  Genitourinary: Negative for dysuria, frequency, hematuria and urgency.  Musculoskeletal: Negative for back pain, falls and myalgias.  Skin: Negative for rash.  Neurological: Negative for dizziness, sensory change, loss of consciousness, weakness and headaches.  Endo/Heme/Allergies: Negative for environmental allergies. Does not bruise/bleed easily.  Psychiatric/Behavioral: Negative for depression and suicidal ideas. The patient is not nervous/anxious and does not have insomnia.        Objective:    Physical Exam Vitals and nursing note reviewed. Exam conducted with a chaperone present.  Constitutional:  Appearance: She is well-developed.  HENT:     Head: Normocephalic and atraumatic.  Eyes:     Conjunctiva/sclera: Conjunctivae normal.  Neck:     Thyroid: No thyromegaly.     Vascular: No carotid bruit or JVD.  Cardiovascular:     Rate and Rhythm: Normal rate and regular rhythm.     Heart sounds: Normal heart sounds. No murmur heard.   Pulmonary:     Effort: Pulmonary effort is normal. No respiratory distress.     Breath sounds: Normal breath sounds. No wheezing or rales.  Chest:     Chest wall:  No tenderness.  Abdominal:     Hernia: A hernia is present.  Genitourinary:    General: Normal vulva.     Exam position: Lithotomy position.     Pubic Area: No rash.      Labia:        Right: No rash, tenderness, lesion or injury.        Left: No rash, tenderness, lesion or injury.      Urethra: No prolapse, urethral pain, urethral swelling or urethral lesion.     Vagina: Normal. No vaginal discharge.     Cervix: No cervical motion tenderness, discharge, friability, lesion, erythema, cervical bleeding or eversion.     Uterus: Normal.      Adnexa: Right adnexa normal and left adnexa normal.       Right: No mass, tenderness or fullness.         Left: No mass, tenderness or fullness.       Rectum: Guaiac result negative. No mass, tenderness, anal fissure, external hemorrhoid or internal hemorrhoid. Normal anal tone.     Comments: Pap done Musculoskeletal:     Cervical back: Normal range of motion and neck supple.  Neurological:     Mental Status: She is alert and oriented to person, place, and time.     BP 118/84 (BP Location: Right Arm, Patient Position: Sitting, Cuff Size: Normal)   Pulse (!) 111   Temp 98.7 F (37.1 C) (Oral)   Resp 18   Ht 5\' 4"  (1.626 m)   Wt 164 lb 9.6 oz (74.7 kg)   LMP 01/08/2021   SpO2 99%   BMI 28.25 kg/m  Wt Readings from Last 3 Encounters:  01/17/21 164 lb 9.6 oz (74.7 kg)  10/20/20 162 lb (73.5 kg)  06/30/20 163 lb (73.9 kg)    Diabetic Foot Exam - Simple   No data filed    Lab Results  Component Value Date   WBC 9.3 10/20/2020   HGB 13.9 10/20/2020   HCT 40.9 10/20/2020   PLT 286.0 10/20/2020   GLUCOSE 100 (H) 10/20/2020   CHOL 197 10/20/2020   TRIG 84.0 10/20/2020   HDL 74.90 10/20/2020   LDLCALC 105 (H) 10/20/2020   ALT 10 10/20/2020   AST 11 10/20/2020   NA 140 10/20/2020   K 3.7 10/20/2020   CL 104 10/20/2020   CREATININE 0.80 10/20/2020   BUN 16 10/20/2020   CO2 30 10/20/2020   TSH 3.78 10/20/2020    Lab Results   Component Value Date   TSH 3.78 10/20/2020   Lab Results  Component Value Date   WBC 9.3 10/20/2020   HGB 13.9 10/20/2020   HCT 40.9 10/20/2020   MCV 91.2 10/20/2020   PLT 286.0 10/20/2020   Lab Results  Component Value Date   NA 140 10/20/2020   K 3.7 10/20/2020   CO2 30 10/20/2020  GLUCOSE 100 (H) 10/20/2020   BUN 16 10/20/2020   CREATININE 0.80 10/20/2020   BILITOT 0.4 10/20/2020   ALKPHOS 77 10/20/2020   AST 11 10/20/2020   ALT 10 10/20/2020   PROT 6.9 10/20/2020   ALBUMIN 4.5 10/20/2020   CALCIUM 9.6 10/20/2020   GFR 90.55 10/20/2020   Lab Results  Component Value Date   CHOL 197 10/20/2020   Lab Results  Component Value Date   HDL 74.90 10/20/2020   Lab Results  Component Value Date   LDLCALC 105 (H) 10/20/2020   Lab Results  Component Value Date   TRIG 84.0 10/20/2020   Lab Results  Component Value Date   CHOLHDL 3 10/20/2020   No results found for: HGBA1C     Assessment & Plan:   Problem List Items Addressed This Visit      Unprioritized   Gynecologic exam normal - Primary    Pap done        Other Visit Diagnoses    Preventative health care       Relevant Orders   Cytology - PAP( [facility])      I am having [name] maintain her dexmethylphenidate, amphetamine-dextroamphetamine, buPROPion, clobetasol, and COVID-19 mRNA vaccine [employer]).  No orders of the defined types were placed in this encounter.    Ann Held, DO

## 2021-01-17 NOTE — Assessment & Plan Note (Signed)
Pap done

## 2021-01-17 NOTE — Patient Instructions (Signed)

## 2021-01-18 LAB — CYTOLOGY - PAP: Diagnosis: NEGATIVE

## 2021-03-20 IMAGING — US US BREAST*R* LIMITED INC AXILLA
1 series · 3 of 3 positions shown · non-contrast
Comparison: Previous exam(s).
COMPARISON: Previous exam(s).

Addendum:
CLINICAL DATA: Focal pain in the RIGHT breast. Pain comes and goes
since [REDACTED].

EXAM:
DIGITAL DIAGNOSTIC UNILATERAL RIGHT MAMMOGRAM WITH TOMOSYNTHESIS AND
CAD; ULTRASOUND RIGHT BREAST LIMITED
TECHNIQUE: Right digital diagnostic mammography and breast tomosynthesis was
performed. The images were evaluated with computer-aided detection.;
Targeted ultrasound examination of the right breast was performed

[Series 1: us breast*right* limited inc axilla · 0.07mm/px · 3 of 3 slices shown]
[im 1/3]
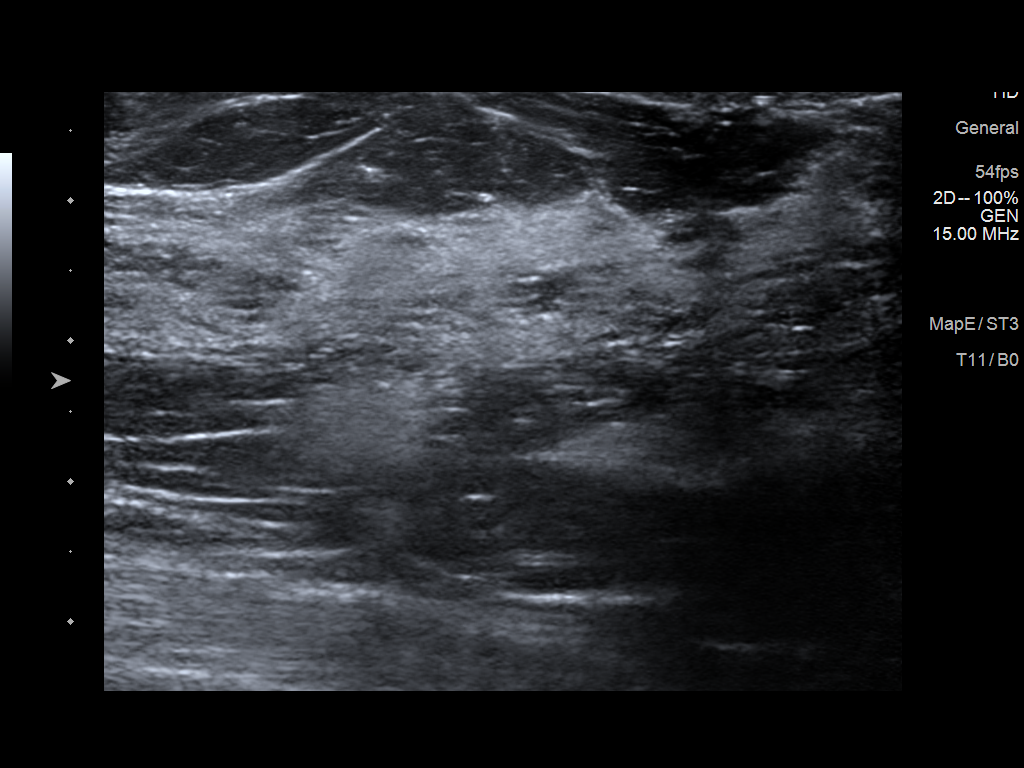
[im 2/3]
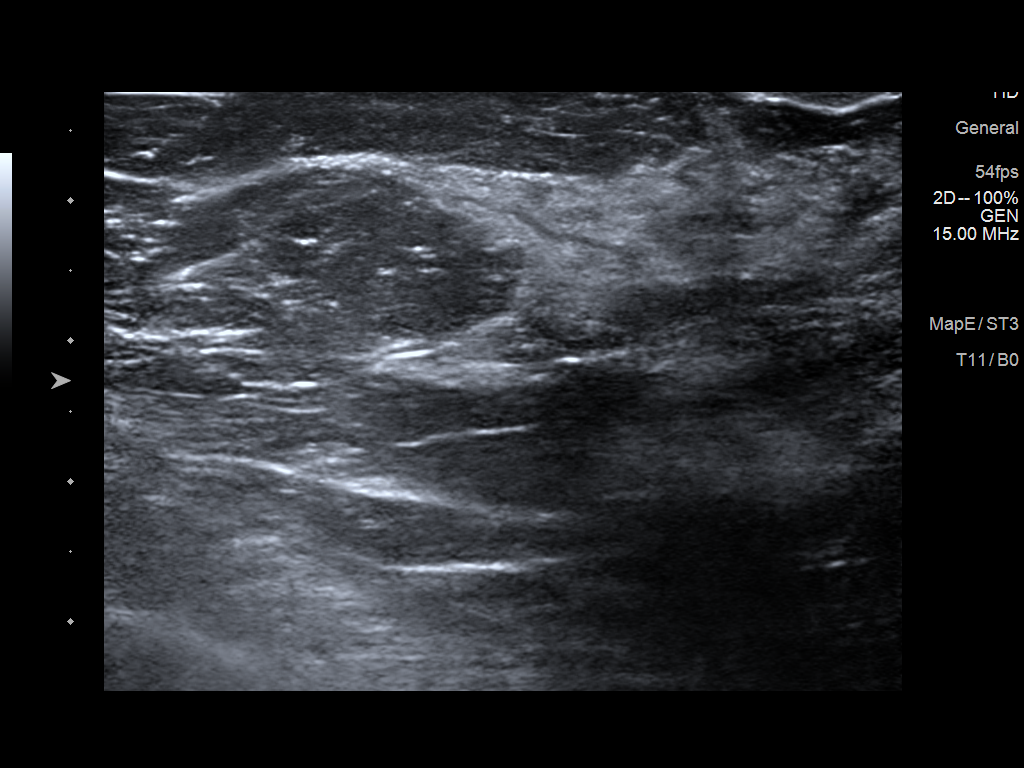
[im 3/3]
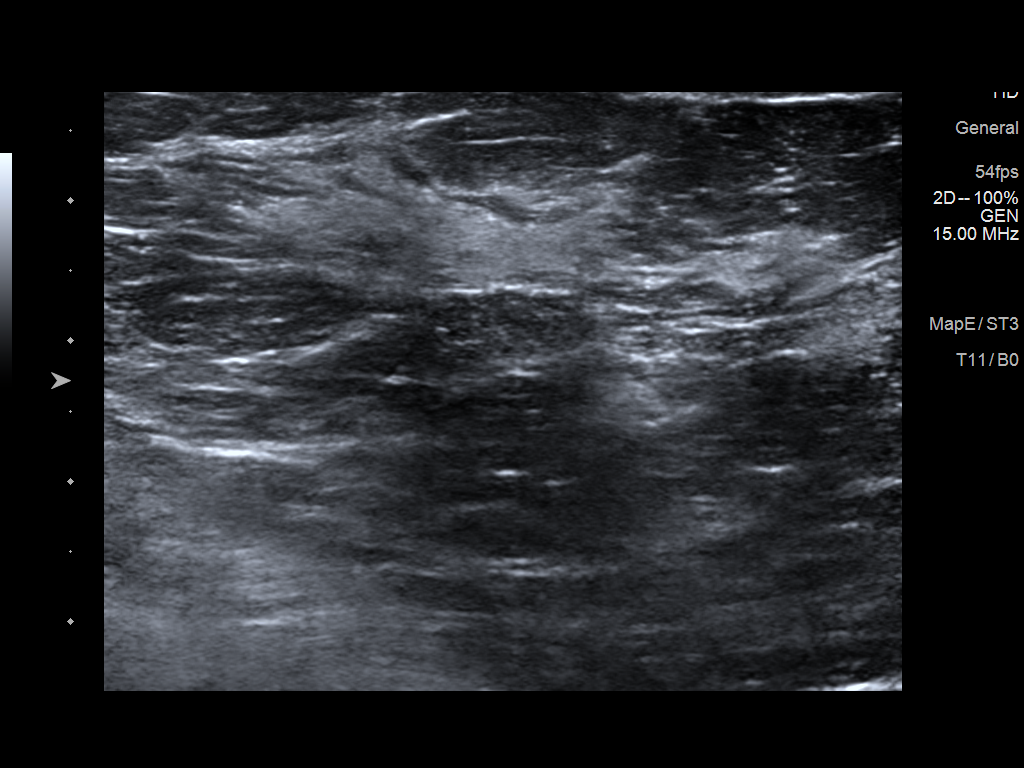

[3 of 3 positions shown; findings below may reference images not displayed]

ACR Breast Density Category c: The breast tissue is heterogeneously
dense, which may obscure small masses.
FINDINGS: No suspicious mass, distortion, or microcalcifications are
identified to suggest presence of malignancy. Spot tangential view
is performed in the area of patient's concern and shows normal
appearing fibroglandular tissue.

Targeted ultrasound is performed, showing normal appearing
fibroglandular tissue in the LATERAL portions of the RIGHT breast.
No suspicious mass, distortion, or acoustic shadowing is
demonstrated with ultrasound.
IMPRESSION: No mammographic or ultrasound evidence for malignancy.

Breast pain is a common condition. It can be exacerbated by
caffeine, hormonal changes, hormonal medications, and weight
changes. It can be improved by wearing adequate support,
over-the-counter pain medication, low-fat diet, exercise, and ice as
needed. Studies have shown an improvement in cyclic pain with use of
evening primrose oil and vitamin E. Often breast pain will resolve
on its own without intervention.

RECOMMENDATION:
Screening mammogram in one year.(Code:AJ-P-CC8)

I have discussed the findings and recommendations with the patient.
If applicable, a reminder letter will be sent to the patient
regarding the next appointment.

BI-RADS CATEGORY  1: Negative.

ADDENDUM:
Screening mammogram is recommended in June 2021.

*** End of Addendum ***
ACR Breast Density Category c: The breast tissue is heterogeneously
dense, which may obscure small masses.
FINDINGS: No suspicious mass, distortion, or microcalcifications are
identified to suggest presence of malignancy. Spot tangential view
is performed in the area of patient's concern and shows normal
appearing fibroglandular tissue.

Targeted ultrasound is performed, showing normal appearing
fibroglandular tissue in the LATERAL portions of the RIGHT breast.
No suspicious mass, distortion, or acoustic shadowing is
demonstrated with ultrasound.
IMPRESSION: No mammographic or ultrasound evidence for malignancy.

Breast pain is a common condition. It can be exacerbated by
caffeine, hormonal changes, hormonal medications, and weight
changes. It can be improved by wearing adequate support,
over-the-counter pain medication, low-fat diet, exercise, and ice as
needed. Studies have shown an improvement in cyclic pain with use of
evening primrose oil and vitamin E. Often breast pain will resolve
on its own without intervention.

RECOMMENDATION:
Screening mammogram in one year.(Code:AJ-P-CC8)

I have discussed the findings and recommendations with the patient.
If applicable, a reminder letter will be sent to the patient
regarding the next appointment.

BI-RADS CATEGORY  1: Negative.

## 2021-04-20 ENCOUNTER — Other Ambulatory Visit (HOSPITAL_BASED_OUTPATIENT_CLINIC_OR_DEPARTMENT_OTHER): Payer: Self-pay | Admitting: Family Medicine

## 2021-04-20 DIAGNOSIS — Z1231 Encounter for screening mammogram for malignant neoplasm of breast: Secondary | ICD-10-CM

## 2021-07-11 ENCOUNTER — Other Ambulatory Visit: Payer: Self-pay

## 2021-07-11 ENCOUNTER — Ambulatory Visit: Payer: 59 | Attending: Internal Medicine

## 2021-07-11 ENCOUNTER — Other Ambulatory Visit (HOSPITAL_BASED_OUTPATIENT_CLINIC_OR_DEPARTMENT_OTHER): Payer: Self-pay

## 2021-07-11 ENCOUNTER — Ambulatory Visit (HOSPITAL_BASED_OUTPATIENT_CLINIC_OR_DEPARTMENT_OTHER)
Admission: RE | Admit: 2021-07-11 | Discharge: 2021-07-11 | Disposition: A | Discharge status: Home / Self Care | Payer: 59 | Source: Ambulatory Visit | Attending: Family Medicine | Admitting: Family Medicine

## 2021-07-11 ENCOUNTER — Encounter (HOSPITAL_BASED_OUTPATIENT_CLINIC_OR_DEPARTMENT_OTHER): Payer: Self-pay

## 2021-07-11 DIAGNOSIS — Z1231 Encounter for screening mammogram for malignant neoplasm of breast: Secondary | ICD-10-CM | POA: Diagnosis present

## 2021-07-11 DIAGNOSIS — Z23 Encounter for immunization: Secondary | ICD-10-CM

## 2021-07-11 MED ORDER — INFLUENZA VAC SPLIT QUAD 0.5 ML IM SUSY
PREFILLED_SYRINGE | INTRAMUSCULAR | 0 refills | Status: DC
  Filled 2021-07-11: qty 0.5, 1d supply, fill #0

## 2021-07-11 NOTE — Progress Notes (Signed)
   Covid-19 Vaccination Clinic  Name:  Desiree Reese    MRN: [id number] DOB: [birth year]  07/11/2021  [name] was observed post Covid-19 immunization for 15 minutes without incident. She was provided with Vaccine Information Sheet and instruction to access the V-Safe system.   [name] was instructed to call 911 with any severe reactions post vaccine: Difficulty breathing  Swelling of face and throat  A fast heartbeat  A bad rash all over body  Dizziness and weakness

## 2021-07-18 ENCOUNTER — Other Ambulatory Visit (HOSPITAL_BASED_OUTPATIENT_CLINIC_OR_DEPARTMENT_OTHER): Payer: Self-pay

## 2021-07-18 MED ORDER — COVID-19MRNA BIVAL VACC PFIZER 30 MCG/0.3ML IM SUSP
INTRAMUSCULAR | 0 refills | Status: DC
  Filled 2021-07-18: qty 0.3, 1d supply, fill #0

## 2021-10-23 IMAGING — MG MM DIGITAL SCREENING BILAT W/ TOMO AND CAD
8 series · 9 of 24 positions shown · non-contrast
Comparison: Previous exam(s).

CLINICAL DATA: Screening.

EXAM:
DIGITAL SCREENING BILATERAL MAMMOGRAM WITH TOMOSYNTHESIS AND CAD
TECHNIQUE: Bilateral screening digital craniocaudal and mediolateral oblique
mammograms were obtained. Bilateral screening digital breast
tomosynthesis was performed. The images were evaluated with
computer-aided detection.

[R CC synth-2D]
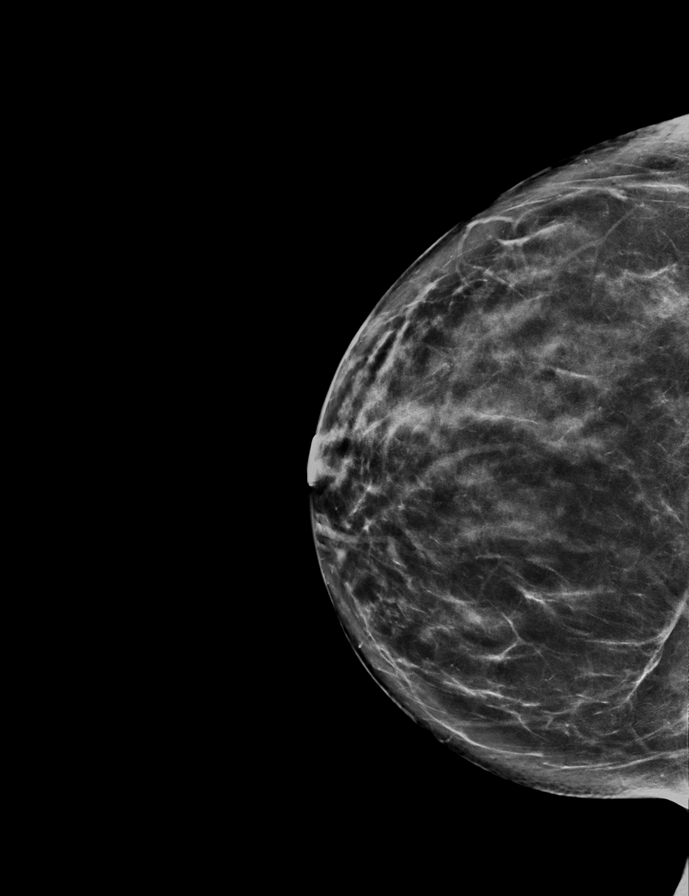

[L CC synth-2D]
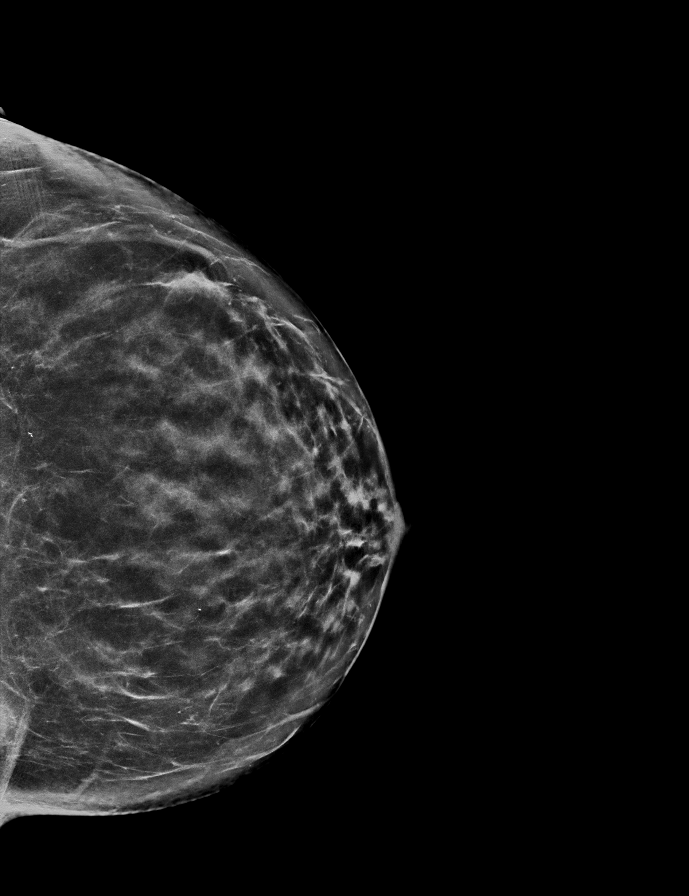

[L MLO synth-2D]
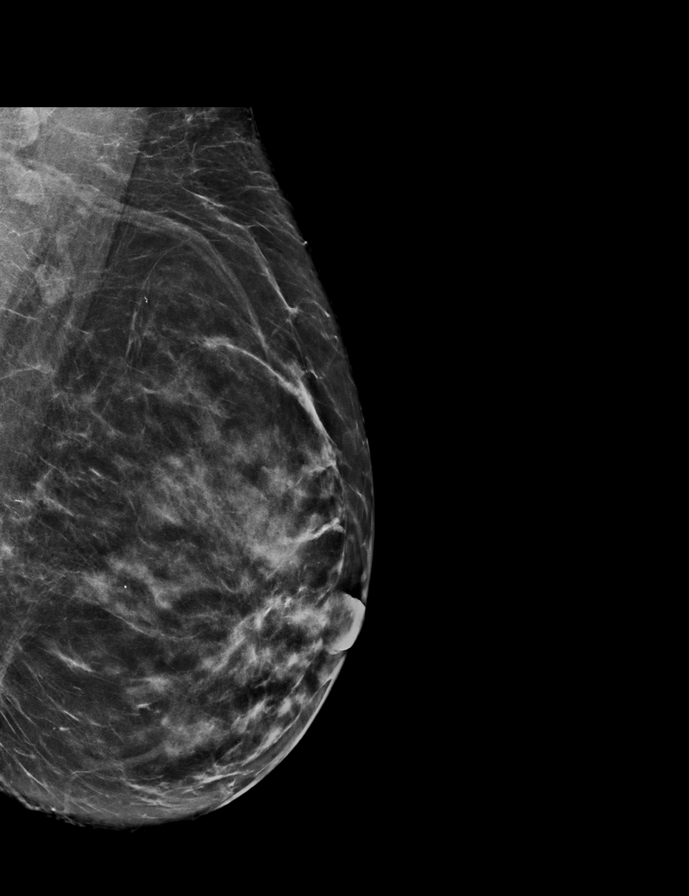

[R MLO synth-2D]
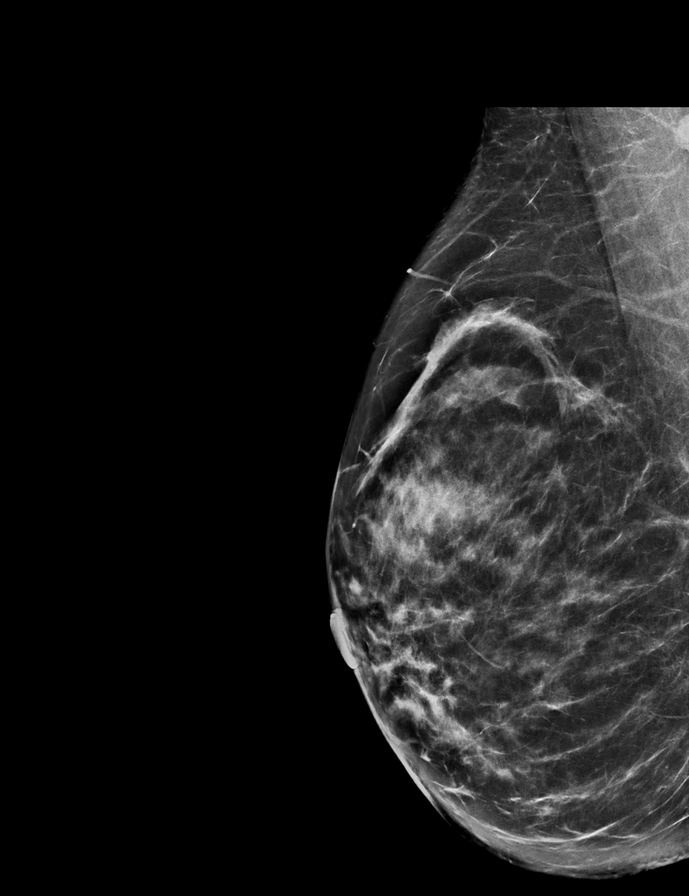

[L MLO tomo · 2 of 70 frames shown]
[frame 23/70]
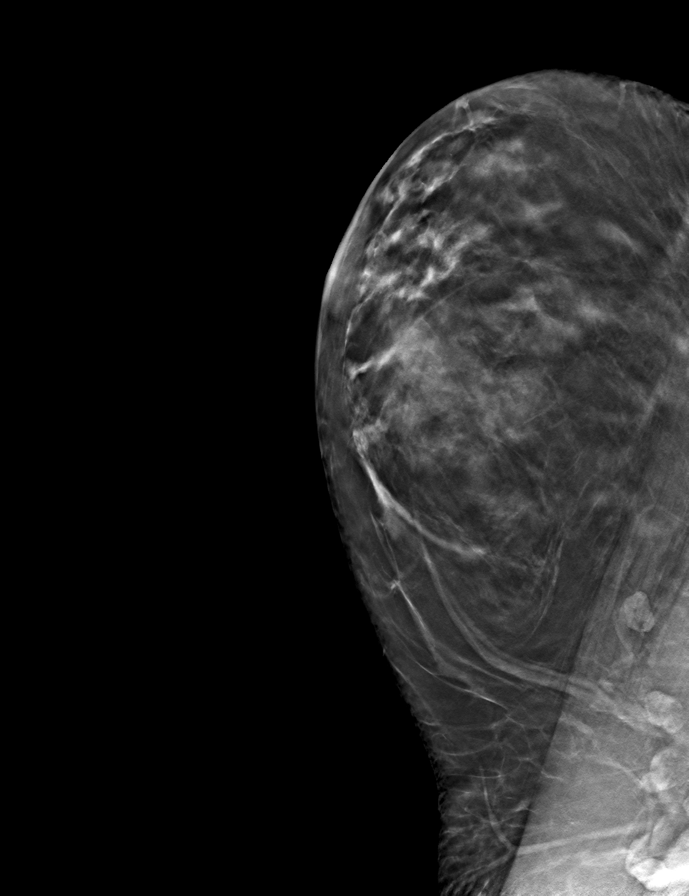
[frame 35/70]
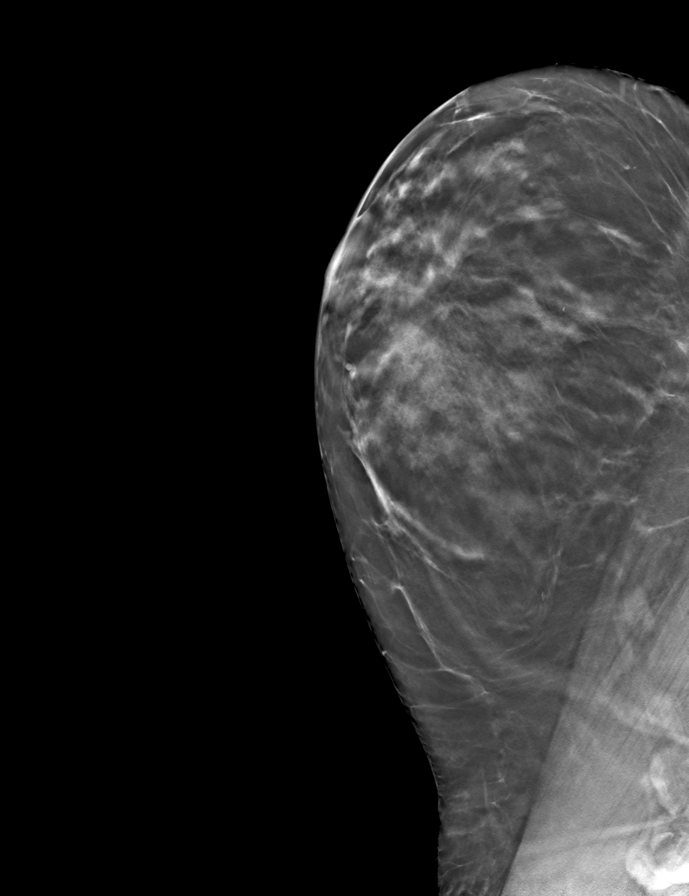

[R MLO tomo · tomo slice 35/68.0]
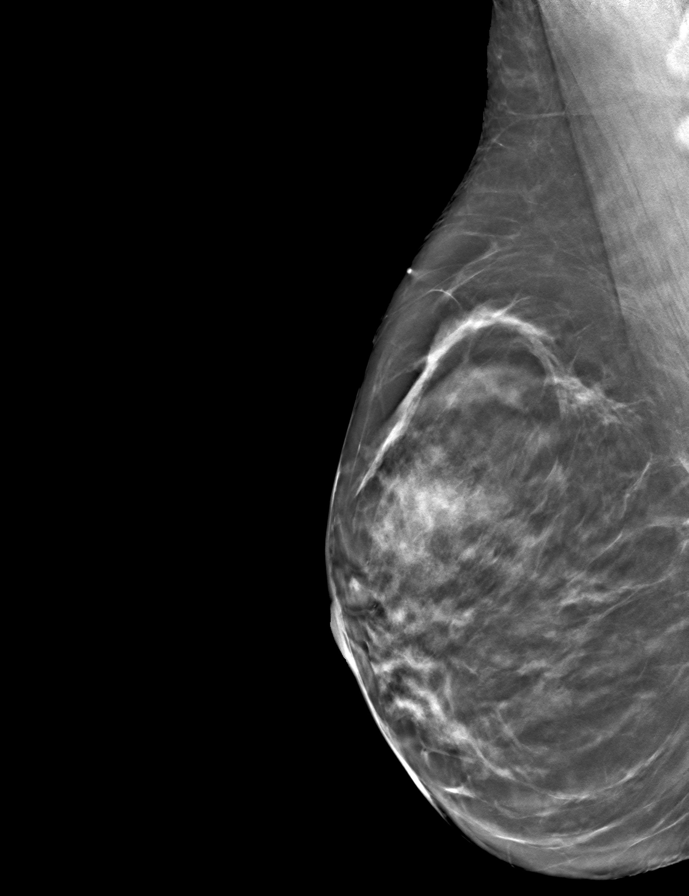

[L CC tomo · tomo slice 35/68.0]
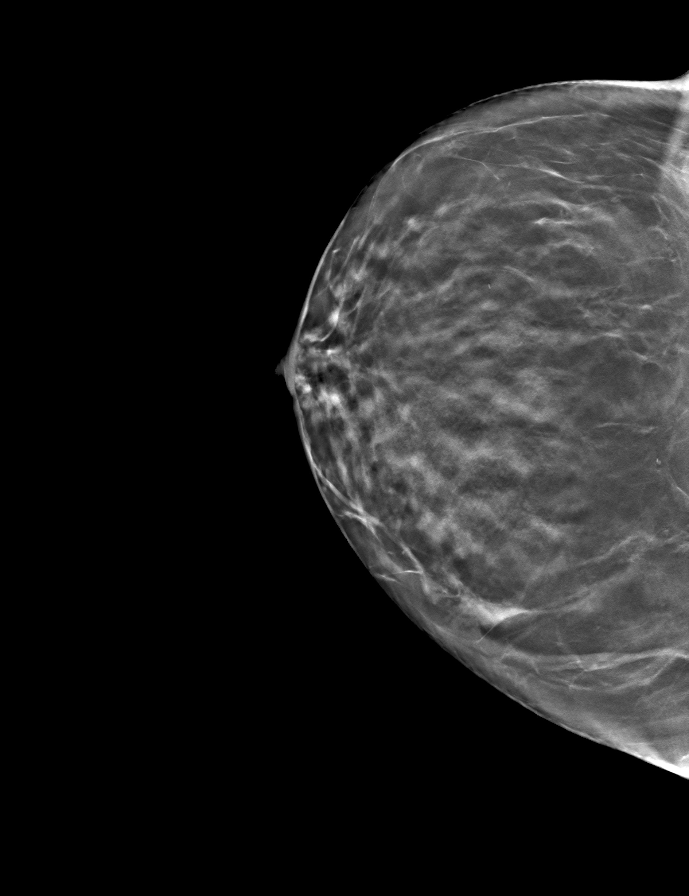

[R CC tomo · tomo slice 36/71.0]
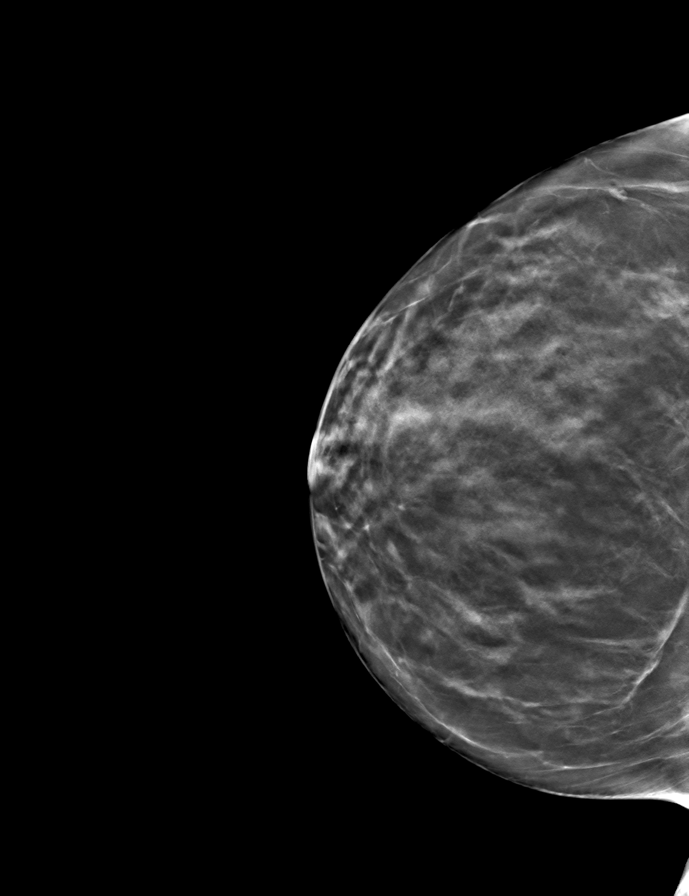

[9 of 24 positions shown; findings below may reference images not displayed]

ACR Breast Density Category c: The breast tissue is heterogeneously
dense, which may obscure small masses.
FINDINGS: There are no findings suspicious for malignancy.
IMPRESSION: No mammographic evidence of malignancy. A result letter of this
screening mammogram will be mailed directly to the patient.

RECOMMENDATION:
Screening mammogram in one year. (Code:Q3-W-BC3)

BI-RADS CATEGORY  1: Negative.

## 2022-07-17 ENCOUNTER — Ambulatory Visit: Payer: 59 | Admitting: Family Medicine

## 2022-07-18 ENCOUNTER — Encounter: Payer: Self-pay | Admitting: Family Medicine

## 2022-07-18 ENCOUNTER — Ambulatory Visit (INDEPENDENT_AMBULATORY_CARE_PROVIDER_SITE_OTHER): Payer: 59 | Admitting: Family Medicine

## 2022-07-18 ENCOUNTER — Other Ambulatory Visit (HOSPITAL_BASED_OUTPATIENT_CLINIC_OR_DEPARTMENT_OTHER): Payer: Self-pay | Admitting: Family Medicine

## 2022-07-18 VITALS — BP 120/80 | HR 110 | Temp 98.1°F | Ht 64.0 in | Wt 163.2 lb

## 2022-07-18 DIAGNOSIS — L409 Psoriasis, unspecified: Secondary | ICD-10-CM

## 2022-07-18 DIAGNOSIS — F988 Other specified behavioral and emotional disorders with onset usually occurring in childhood and adolescence: Secondary | ICD-10-CM

## 2022-07-18 DIAGNOSIS — Z1231 Encounter for screening mammogram for malignant neoplasm of breast: Secondary | ICD-10-CM

## 2022-07-18 DIAGNOSIS — J302 Other seasonal allergic rhinitis: Secondary | ICD-10-CM

## 2022-07-18 DIAGNOSIS — Z79899 Other long term (current) drug therapy: Secondary | ICD-10-CM | POA: Diagnosis not present

## 2022-07-18 DIAGNOSIS — R69 Illness, unspecified: Secondary | ICD-10-CM | POA: Diagnosis not present

## 2022-07-18 MED ORDER — CLOBETASOL PROPIONATE 0.05 % EX SOLN: 1.0000 | Freq: Two times a day (BID) | CUTANEOUS | 0 refills | Status: DC

## 2022-07-18 MED ORDER — FEXOFENADINE-PSEUDOEPHED ER 180-240 MG PO TB24: 1.0000 | ORAL_TABLET | Freq: Every day | ORAL | 2 refills | Status: DC

## 2022-07-18 MED ORDER — BUPROPION HCL ER (XL) 300 MG PO TB24: 300.0000 mg | ORAL_TABLET | Freq: Every day | ORAL | 3 refills | Status: DC

## 2022-07-18 MED ORDER — FLUTICASONE PROPIONATE 50 MCG/ACT NA SUSP: 2.0000 | Freq: Every day | NASAL | 2 refills | Status: DC

## 2022-07-18 NOTE — Patient Instructions (Addendum)
Continue to push fluids, practice good hand hygiene, and cover your mouth if you cough.  If you start having fevers, shaking or shortness of breath, seek immediate care.  Don't forget to use your nasal spray if things flare up.   Let us know if you need anything.

## 2022-07-18 NOTE — Progress Notes (Signed)
Chief Complaint  Patient presents with   Cough    But almost gone and feeling better since making the appointment Would like a prescription for allegra D    Desiree Reese here for URI complaints.  Duration: 4 weeks  Was cleaning out things that was dusty Associated symptoms: sinus congestion, rhinorrhea, and coughing; s/ss are improved over the past 2 d Denies: sinus pain, itchy watery eyes, ear pain, ear drainage, sore throat, wheezing, shortness of breath, myalgia, and fevers Treatment to date: albuterol, Allegra D Sick contacts: No Tested neg for covid.   The patient has a history of psoriasis.  It affects her scalp.  She is requesting a refill of clobetasol solution which helps with her symptoms.  The patient has a history of ADHD.  She currently sees a psychiatrist who has her on Adderall 3 times daily in addition to Wellbutrin.  She is requesting a refill of the Wellbutrin and also requesting that her PCP, Dr. Etter Sjogren, takes over prescription management of this medication.  Past Medical History:  Diagnosis Date   Active labor 12/24/2014   Active labor 12/24/2014   Anxiety    Depression    Fibroid    Post-term pregnancy, 40-42 weeks of gestation 12/24/2014    Objective BP 120/80 (BP Location: Left Arm, Patient Position: Sitting, Cuff Size: Normal)   Pulse (!) 110   Temp 98.1 F (36.7 C) (Oral)   Ht '5\' 4"'$  (1.626 m)   Wt 163 lb 4 oz (74 kg)   SpO2 98%   BMI 28.02 kg/m  General: Awake, alert, appears stated age HEENT: AT, [place], ears patent b/l and TM's neg, nares patent w/o discharge, pharynx pink and without exudates, MMM Neck: No masses or asymmetry Heart: Tachycardic, regular rhythm Lungs: CTAB, no accessory muscle use Psych: Age appropriate judgment and insight, normal mood and affect  Seasonal allergies - Plan: fluticasone (FLONASE) 50 MCG/ACT nasal spray  Encounter for long-term (current) use of high-risk medication - Plan: Drug Monitoring Panel [phone] ,  Urine  Psoriasis - Plan: clobetasol (TEMOVATE) 0.05 % external solution  Attention deficit disorder (ADD) in adult - Plan: buPROPion (WELLBUTRIN XL) 300 MG 24 hr tablet  She likely had some sort of reaction from being exposed to dust where her lungs were inflamed.  Interestingly she did not take the prednisone she was prescribed by urgent care as I would think this would have helped her little bit earlier.  I do not think she needs to take this this time.  We will refill Allegra-D for when she has flares of symptoms like this.  She will continue her oral antihistamine otherwise and use Flonase as needed which she had forgotten to recently. We will refill clobetasol solution for this to use on her scalp twice daily as needed. I am happy to refill the Wellbutrin for her.  I told her I am unsure if Dr. Etter Sjogren will continue the Adderall.  We checked with her MA and we will at least get a uds today.  We will defer future refills to her regular PCP. Follow-up with PCP as originally scheduled. Pt voiced understanding and agreement to the plan.  I spent 30 minutes with the patient discussing the above plans in addition to reviewing her chart and the same day of the visit.  [place], DO 07/18/22 4:32 PM

## 2022-07-20 LAB — DRUG MONITORING PANEL 376104, URINE
Amphetamine: 3796 ng/mL — ABNORMAL HIGH (ref <250)
Amphetamines: POSITIVE ng/mL — AB (ref <500)
Barbiturates: NEGATIVE ng/mL (ref <300)
Benzodiazepines: NEGATIVE ng/mL (ref <100)
Cocaine Metabolite: NEGATIVE ng/mL (ref <150)
Desmethyltramadol: NEGATIVE ng/mL (ref <100)
Methamphetamine: NEGATIVE ng/mL (ref <250)
Opiates: NEGATIVE ng/mL (ref <100)
Oxycodone: NEGATIVE ng/mL (ref <100)
Tramadol: NEGATIVE ng/mL (ref <100)

## 2022-07-20 LAB — DM TEMPLATE

## 2022-07-23 ENCOUNTER — Other Ambulatory Visit: Payer: Self-pay | Admitting: Family Medicine

## 2022-07-25 ENCOUNTER — Other Ambulatory Visit: Payer: Self-pay | Admitting: Family Medicine

## 2022-07-26 ENCOUNTER — Encounter: Payer: Self-pay | Admitting: Family Medicine

## 2022-07-26 ENCOUNTER — Ambulatory Visit (HOSPITAL_BASED_OUTPATIENT_CLINIC_OR_DEPARTMENT_OTHER)
Admission: RE | Admit: 2022-07-26 | Discharge: 2022-07-26 | Disposition: A | Discharge status: Home / Self Care | Payer: 59 | Source: Ambulatory Visit | Attending: Family Medicine | Admitting: Family Medicine

## 2022-07-26 DIAGNOSIS — Z1231 Encounter for screening mammogram for malignant neoplasm of breast: Secondary | ICD-10-CM | POA: Insufficient documentation

## 2022-08-02 ENCOUNTER — Encounter: Payer: Self-pay | Admitting: Family Medicine

## 2022-08-02 ENCOUNTER — Ambulatory Visit (INDEPENDENT_AMBULATORY_CARE_PROVIDER_SITE_OTHER): Payer: 59 | Admitting: Family Medicine

## 2022-08-02 VITALS — BP 110/80 | HR 107 | Temp 97.9°F | Resp 16 | Ht 64.0 in | Wt 160.4 lb

## 2022-08-02 DIAGNOSIS — Z23 Encounter for immunization: Secondary | ICD-10-CM | POA: Diagnosis not present

## 2022-08-02 DIAGNOSIS — R69 Illness, unspecified: Secondary | ICD-10-CM | POA: Diagnosis not present

## 2022-08-02 DIAGNOSIS — F988 Other specified behavioral and emotional disorders with onset usually occurring in childhood and adolescence: Secondary | ICD-10-CM

## 2022-08-02 MED ORDER — AMPHETAMINE-DEXTROAMPHETAMINE 20 MG PO TABS: ORAL_TABLET | ORAL | 0 refills | Status: DC

## 2022-08-02 NOTE — Patient Instructions (Signed)
Living With Attention Deficit Hyperactivity Disorder If you have been diagnosed with attention deficit hyperactivity disorder (ADHD), you may be relieved that you now know why you have felt or behaved a certain way. Still, you may feel overwhelmed about the treatment ahead. You may also wonder how to get the support you need and how to deal with the condition day-to-day. With treatment and support, you can live with ADHD and manage your symptoms. How to manage lifestyle changes Managing lifestyle changes can be challenging. Seeking support from your healthcare provider, therapist, family, and friends can be helpful. How to recognize changes in your condition The following signs may mean that your treatment is working well and your condition is improving: Consistently being on time for appointments. Being more organized at home and work. Other people noticing improvements in your behavior. Achieving goals that you set for yourself. Thinking more clearly. The following signs may mean that your treatment is not working very well: Feeling impatience or more confusion. Missing, forgetting, or being late for appointments. An increasing sense of disorganization and messiness. More difficulty in reaching goals that you set for yourself. Loved ones becoming angry or frustrated with you. Follow these instructions at home: Medicines Take over-the-counter and prescription medicines only as told by your health care provider. Check with your health care provider before taking any new medicines. General instructions Create structure and an organized atmosphere at home. For example: Make a list of tasks, then rank them from most important to least important. Work on one task at a time until your listed tasks are done. Make a daily schedule and follow it consistently every day. Use an appointment calendar, and check it 2-3 times a day to keep on track. Keep it with you when you leave the house. Create  spaces where you keep certain things, and always put things back in their places after you use them. Keep all follow-up visits. Your health care provider will need to monitor your condition and adjust your treatment over time. Where to find support Talking to others  Keep emotion out of important discussions and speak in a calm, logical way. Listen closely and patiently to your loved ones. Try to understand their point of view, and try to avoid getting defensive. Take responsibility for the consequences of your actions. Ask that others do not take your behaviors personally. Aim to solve problems as they come up, and express your feelings instead of bottling them up. Talk openly about what you need from your loved ones and how they can support you. Consider going to family therapy sessions or having your family meet with a specialist who deals with ADHD-related behavior problems. Finances Not all insurance plans cover mental health care, so it is important to check with your insurance carrier. If paying for co-pays or counseling services is a problem, search for a local or county mental health care center. Public mental health care services may be offered there at a low cost or no cost when you are not able to see a private health care provider. If you are taking medicine for ADHD, you may be able to get the generic form, which may be less expensive than brand-name medicine. Some makers of prescription medicines also offer help to patients who cannot afford the medicines that they need. Therapy and support groups Talking with a mental health care provider and participating in support groups can help to improve your quality of life, daily functioning, and overall symptoms. Questions to ask your health   care provider: What are the risks and benefits of taking medicines? Would I benefit from therapy? How often should I follow up with a health care provider? Where to find more information Learn more  about ADHD from: Children and Adults with Attention Deficit Hyperactivity Disorder: chadd.org National Institute of Mental Health: nimh.nih.gov Centers for Disease Control and Prevention: cdc.gov Contact a health care provider if: You have side effects from your medicines, such as: Repeated muscle twitches, coughing, or speech outbursts. Sleep problems. Loss of appetite. Dizziness. Unusually fast heartbeat. Stomach pains. Headaches. You have new or worsening behavior problems. You are struggling with anxiety, depression, or substance abuse. Get help right away if: You have a severe reaction to a medicine. These symptoms may be an emergency. Get help right away. Call 911. Do not wait to see if the symptoms will go away. Do not drive yourself to the hospital. Take one of these steps if you feel like you may hurt yourself or others, or have thoughts about taking your own life: Go to your nearest emergency room. Call 911. Call the National Suicide Prevention Lifeline at 1-800-273-8255 or 988. This is open 24 hours a day. Text the Crisis Text Line at 741741. Summary With treatment and support, you can live with ADHD and manage your symptoms. Consider taking part in family therapy or self-help groups with family members or friends. When you talk with friends and family about your ADHD, be patient and communicate openly. Keep all follow-up visits. Your health care provider will need to monitor your condition and adjust your treatment over time. This information is not intended to replace advice given to you by your health care provider. Make sure you discuss any questions you have with your health care provider. Document Revised: 01/19/2022 Document Reviewed: 01/19/2022 Elsevier Patient Education  2023 Elsevier Inc.  

## 2022-08-02 NOTE — Progress Notes (Signed)
Subjective:   By signing my name below, I, Desiree Reese, attest that this documentation has been prepared under the direction and in the presence of Desiree Held, Desiree Reese. 08/02/2022    Patient ID: Desiree Reese, female    DOB: 12/18/1975, 46 y.o.   MRN: [id number]  Chief Complaint  Patient presents with   Medication Management    HPI Patient is in today for a office visit.   She continues taking 20 mg adderall 3x daily PO and reports doing well while taking it. She is also requesting a refill for it as well.  Her blood pressure is doing well during this visit but her pulse is elevated. She had taken the stairs to this visit and her second dose of adderall an hour ago which she thinks can increase her heart rate.  BP Readings from Last 3 Encounters:  08/02/22 110/80  07/18/22 120/80  01/17/21 118/84   Pulse Readings from Last 3 Encounters:  08/02/22 (!) 107  07/18/22 (!) 110  01/17/21 (!) 111   She is interested in receiving the flu vaccine during this visit. She is interested in receiving the new Covid-19 booster vaccine at a later time.    Past Medical History:  Diagnosis Date   Active labor 12/24/2014   Active labor 12/24/2014   Anxiety    Depression    Fibroid    Post-term pregnancy, 40-42 weeks of gestation 12/24/2014    Past Surgical History:  Procedure Laterality Date   CESAREAN SECTION     CESAREAN SECTION N/A 12/25/2014   Procedure: CESAREAN SECTION;  Surgeon: Aloha Gell, MD;  Location: [facility];  Service: Obstetrics;  Laterality: N/A;   TONSILLECTOMY      History reviewed. No pertinent family history.  Social History   Socioeconomic History   Marital status: Married    Spouse name: Not on file   Number of children: Not on file   Years of education: Not on file   Highest education level: Not on file  Occupational History   Not on file  Tobacco Use   Smoking status: Never   Smokeless tobacco: Never  Substance and Sexual Activity    Alcohol use: Yes    Comment: occas. prior to preg.   Drug use: No   Sexual activity: Yes  Other Topics Concern   Not on file  Social History Narrative   Not on file   Social Determinants of Health   Financial Resource Strain: Not on file  Food Insecurity: Not on file  Transportation Needs: Not on file  Physical Activity: Not on file  Stress: Not on file  Social Connections: Not on file  Intimate Partner Violence: Not on file    Outpatient Medications Prior to Visit  Medication Sig Dispense Refill   buPROPion (WELLBUTRIN XL) 300 MG 24 hr tablet Take 1 tablet (300 mg total) by mouth daily. 90 tablet 3   clobetasol (TEMOVATE) 0.05 % external solution Apply 1 Application topically 2 (two) times daily. 50 mL 0   fexofenadine-pseudoephedrine (ALLEGRA-D ALLERGY & CONGESTION) 180-240 MG 24 hr tablet Take 1 tablet by mouth daily. 30 tablet 2   fluticasone (FLONASE) 50 MCG/ACT nasal spray Place 2 sprays into both nostrils daily. 16 g 2   amphetamine-dextroamphetamine (ADDERALL) 20 MG tablet Take 20 mg by mouth 3 (three) times daily.     COVID-19 mRNA bivalent vaccine, Pfizer, injection Inject into the muscle. (Patient not taking: Reported on 08/02/2022) 0.3 mL 0   influenza  vac split quadrivalent PF (FLUARIX) 0.5 ML injection Inject into the muscle. (Patient not taking: Reported on 08/02/2022) 0.5 mL 0   No facility-administered medications prior to visit.    No Known Allergies  Review of Systems  Constitutional:  Negative for fever and malaise/fatigue.  HENT:  Negative for congestion.   Eyes:  Negative for blurred vision.  Respiratory:  Negative for shortness of breath.   Cardiovascular:  Negative for chest pain, palpitations and leg swelling.  Gastrointestinal:  Negative for abdominal pain, blood in stool and nausea.  Genitourinary:  Negative for dysuria and frequency.  Musculoskeletal:  Negative for falls.  Skin:  Negative for rash.  Neurological:  Negative for dizziness, loss  of consciousness and headaches.  Endo/Heme/Allergies:  Negative for environmental allergies.  Psychiatric/Behavioral:  Negative for depression. The patient is not nervous/anxious.        Objective:    Physical Exam Vitals and nursing note reviewed.  Constitutional:      General: She is not in acute distress.    Appearance: Normal appearance. She is well-developed. She is not ill-appearing.  HENT:     Head: Normocephalic and atraumatic.     Right Ear: External ear normal.     Left Ear: External ear normal.  Eyes:     Extraocular Movements: Extraocular movements intact.     Conjunctiva/sclera: Conjunctivae normal.     Pupils: Pupils are equal, round, and reactive to light.  Neck:     Thyroid: No thyromegaly.     Vascular: No carotid bruit or JVD.  Cardiovascular:     Rate and Rhythm: Normal rate and regular rhythm.     Heart sounds: Normal heart sounds. No murmur heard.    No gallop.  Pulmonary:     Effort: Pulmonary effort is normal. No respiratory distress.     Breath sounds: Normal breath sounds. No wheezing or rales.  Chest:     Chest wall: No tenderness.  Musculoskeletal:     Cervical back: Normal range of motion and neck supple.  Skin:    General: Skin is warm and dry.  Neurological:     Mental Status: She is alert and oriented to person, place, and time.  Psychiatric:        Judgment: Judgment normal.     BP 110/80 (BP Location: Right Arm, Patient Position: Sitting, Cuff Size: Normal)   Pulse (!) 107   Temp 97.9 F (36.6 C) (Oral)   Resp 16   Ht '5\' 4"'$  (1.626 m)   Wt 160 lb 6.4 oz (72.8 kg)   LMP 07/20/2022 (Approximate)   SpO2 98%   BMI 27.53 kg/m  Wt Readings from Last 3 Encounters:  08/02/22 160 lb 6.4 oz (72.8 kg)  07/18/22 163 lb 4 oz (74 kg)  01/17/21 164 lb 9.6 oz (74.7 kg)    Diabetic Foot Exam - Simple   No data filed    Lab Results  Component Value Date   WBC 9.3 10/20/2020   HGB 13.9 10/20/2020   HCT 40.9 10/20/2020   PLT 286.0  10/20/2020   GLUCOSE 100 (H) 10/20/2020   CHOL 197 10/20/2020   TRIG 84.0 10/20/2020   HDL 74.90 10/20/2020   LDLCALC 105 (H) 10/20/2020   ALT 10 10/20/2020   AST 11 10/20/2020   NA 140 10/20/2020   K 3.7 10/20/2020   CL 104 10/20/2020   CREATININE 0.80 10/20/2020   BUN 16 10/20/2020   CO2 30 10/20/2020   TSH 3.78 10/20/2020  Lab Results  Component Value Date   TSH 3.78 10/20/2020   Lab Results  Component Value Date   WBC 9.3 10/20/2020   HGB 13.9 10/20/2020   HCT 40.9 10/20/2020   MCV 91.2 10/20/2020   PLT 286.0 10/20/2020   Lab Results  Component Value Date   NA 140 10/20/2020   K 3.7 10/20/2020   CO2 30 10/20/2020   GLUCOSE 100 (H) 10/20/2020   BUN 16 10/20/2020   CREATININE 0.80 10/20/2020   BILITOT 0.4 10/20/2020   ALKPHOS 77 10/20/2020   AST 11 10/20/2020   ALT 10 10/20/2020   PROT 6.9 10/20/2020   ALBUMIN 4.5 10/20/2020   CALCIUM 9.6 10/20/2020   GFR 90.55 10/20/2020   Lab Results  Component Value Date   CHOL 197 10/20/2020   Lab Results  Component Value Date   HDL 74.90 10/20/2020   Lab Results  Component Value Date   LDLCALC 105 (H) 10/20/2020   Lab Results  Component Value Date   TRIG 84.0 10/20/2020   Lab Results  Component Value Date   CHOLHDL 3 10/20/2020   No results found for: "HGBA1C"     Assessment & Plan:   Problem List Items Addressed This Visit       Unprioritized   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    Refill adderall  uds / condtract utd  Database reviewed       Relevant Medications   amphetamine-dextroamphetamine (ADDERALL) 20 MG tablet   amphetamine-dextroamphetamine (ADDERALL) 20 MG tablet   amphetamine-dextroamphetamine (ADDERALL) 20 MG tablet   Other Visit Diagnoses     Need for influenza vaccination       Relevant Orders   Flu Vaccine QUAD 66moIM (Fluarix, Fluzone & Alfiuria Quad PF) (Completed)        Meds ordered this encounter  Medications   amphetamine-dextroamphetamine  (ADDERALL) 20 MG tablet    Sig: 1po tid    Dispense:  90 tablet    Refill:  0    Desiree Reese not refill Dec   amphetamine-dextroamphetamine (ADDERALL) 20 MG tablet    Sig: 1 po tid    Dispense:  90 tablet    Refill:  0    Desiree Reese not refill until Nov   amphetamine-dextroamphetamine (ADDERALL) 20 MG tablet    Sig: 1 po tid    Dispense:  90 tablet    Refill:  0    I, YAnn Held Desiree Reese, personally preformed the services described in this documentation.  All medical record entries made by the scribe were at my direction and in my presence.  I have reviewed the chart and discharge instructions (if applicable) and agree that the record reflects my personal performance and is accurate and complete. 07/23/2022   I,Desiree Reese,acting as a scribe for YAnn Held Desiree Reese.,have documented all relevant documentation on the behalf of YAnn Held Desiree Reese,as directed by  YAnn Held Desiree Reese while in the presence of YAnn Held Desiree Reese.   YAnn Held Desiree Reese

## 2022-08-02 NOTE — Assessment & Plan Note (Signed)
Refill adderall  uds / condtract utd  Database reviewed

## 2022-09-24 ENCOUNTER — Other Ambulatory Visit: Payer: Self-pay | Admitting: Family Medicine

## 2022-09-24 DIAGNOSIS — F988 Other specified behavioral and emotional disorders with onset usually occurring in childhood and adolescence: Secondary | ICD-10-CM

## 2022-10-26 ENCOUNTER — Other Ambulatory Visit: Payer: Self-pay | Admitting: Family Medicine

## 2022-10-26 DIAGNOSIS — F988 Other specified behavioral and emotional disorders with onset usually occurring in childhood and adolescence: Secondary | ICD-10-CM

## 2022-10-26 NOTE — Telephone Encounter (Signed)
Requesting: Adderall '20mg'$   Contract: 08/02/22 UDS: 07/18/22 Last Visit: 08/02/22 Next Visit: None Last Refill: 08/02/22 #90 and 0RF (x3)  Please Advise

## 2022-10-29 MED ORDER — AMPHETAMINE-DEXTROAMPHETAMINE 20 MG PO TABS: ORAL_TABLET | ORAL | 0 refills | Status: DC

## 2022-12-06 ENCOUNTER — Other Ambulatory Visit: Payer: Self-pay | Admitting: Family Medicine

## 2022-12-06 DIAGNOSIS — F988 Other specified behavioral and emotional disorders with onset usually occurring in childhood and adolescence: Secondary | ICD-10-CM

## 2023-02-20 ENCOUNTER — Other Ambulatory Visit: Payer: Self-pay | Admitting: Family Medicine

## 2023-02-20 DIAGNOSIS — F988 Other specified behavioral and emotional disorders with onset usually occurring in childhood and adolescence: Secondary | ICD-10-CM

## 2023-02-21 ENCOUNTER — Other Ambulatory Visit: Payer: Self-pay | Admitting: Family Medicine

## 2023-02-21 DIAGNOSIS — F988 Other specified behavioral and emotional disorders with onset usually occurring in childhood and adolescence: Secondary | ICD-10-CM

## 2023-02-21 MED ORDER — AMPHETAMINE-DEXTROAMPHETAMINE 20 MG PO TABS: ORAL_TABLET | ORAL | 0 refills | Status: DC

## 2023-02-21 NOTE — Telephone Encounter (Signed)
Requesting: Adderall 20mg  Contract: 08/02/22 UDS: 07/18/22 Last Visit:  08/02/22 Next Visit: None Last Refill: 10/29/22 #90 and 0rf (x3)  Please Advise

## 2023-04-15 ENCOUNTER — Other Ambulatory Visit: Payer: Self-pay | Admitting: Family Medicine

## 2023-04-15 DIAGNOSIS — F988 Other specified behavioral and emotional disorders with onset usually occurring in childhood and adolescence: Secondary | ICD-10-CM

## 2023-04-15 MED ORDER — AMPHETAMINE-DEXTROAMPHETAMINE 20 MG PO TABS: 20.0000 mg | ORAL_TABLET | Freq: Three times a day (TID) | ORAL | 0 refills | Status: DC

## 2023-04-15 NOTE — Telephone Encounter (Signed)
Requesting: adderall Contract:08/02/22 UDS:07/18/22 Last Visit: 08/02/22 Next Visit: none Last Refill: 02/21/23 #90 and 0RF  New pharmacy- [facility] [address] [place]  Please Advise

## 2023-04-16 ENCOUNTER — Other Ambulatory Visit: Payer: Self-pay | Admitting: Family Medicine

## 2023-04-16 DIAGNOSIS — F988 Other specified behavioral and emotional disorders with onset usually occurring in childhood and adolescence: Secondary | ICD-10-CM

## 2023-04-16 DIAGNOSIS — L409 Psoriasis, unspecified: Secondary | ICD-10-CM

## 2023-04-16 MED ORDER — CLOBETASOL PROPIONATE 0.05 % EX SOLN: 1.0000 | Freq: Two times a day (BID) | CUTANEOUS | 0 refills | Status: DC

## 2023-04-23 ENCOUNTER — Telehealth (INDEPENDENT_AMBULATORY_CARE_PROVIDER_SITE_OTHER): Payer: 59 | Admitting: Family Medicine

## 2023-04-23 ENCOUNTER — Encounter: Payer: Self-pay | Admitting: Family Medicine

## 2023-04-23 DIAGNOSIS — F988 Other specified behavioral and emotional disorders with onset usually occurring in childhood and adolescence: Secondary | ICD-10-CM | POA: Diagnosis not present

## 2023-04-23 DIAGNOSIS — S61432A Puncture wound without foreign body of left hand, initial encounter: Secondary | ICD-10-CM

## 2023-04-23 DIAGNOSIS — R140 Abdominal distension (gaseous): Secondary | ICD-10-CM

## 2023-04-23 DIAGNOSIS — Z1211 Encounter for screening for malignant neoplasm of colon: Secondary | ICD-10-CM

## 2023-04-23 MED ORDER — AMPHETAMINE-DEXTROAMPHETAMINE 20 MG PO TABS: 20.0000 mg | ORAL_TABLET | Freq: Three times a day (TID) | ORAL | 0 refills | Status: DC

## 2023-04-23 MED ORDER — DOXYCYCLINE HYCLATE 100 MG PO TABS
100.0000 mg | ORAL_TABLET | Freq: Two times a day (BID) | ORAL | 0 refills | Status: DC
Start: 2023-04-23 — End: 2023-09-11

## 2023-04-23 MED ORDER — AMPHETAMINE-DEXTROAMPHETAMINE 20 MG PO TABS
ORAL_TABLET | ORAL | 0 refills | Status: DC
Start: 2023-06-24 — End: 2023-07-08

## 2023-04-23 MED ORDER — AMPHETAMINE-DEXTROAMPHETAMINE 20 MG PO TABS
ORAL_TABLET | ORAL | 0 refills | Status: DC
Start: 2023-05-24 — End: 2023-10-04

## 2023-04-23 NOTE — Progress Notes (Signed)
MyChart Video Visit    Virtual Visit via Video Note   This patient is at least at moderate risk for complications without adequate follow up. This format is felt to be most appropriate for this patient at this time. Physical exam was limited by quality of the video and audio technology used for the visit. [name] was able to get the patient set up on a video visit.  Patient location: home Patient and provider in visit Provider location: Office  I discussed the limitations of evaluation and management by telemedicine and the availability of in person appointments. The patient expressed understanding and agreed to proceed.  Visit Date: 04/23/2023  Today's healthcare provider: Donato Schultz, DO     Subjective:    Patient ID: Desiree Reese, female    DOB: July 23, 1976, 47 y.o.   MRN: [id number]  No chief complaint on file.   HPI Patient is in today for f/u add.  Discussed the use of AI scribe software for clinical note transcription with the patient, who gave verbal consent to proceed.  History of Present Illness   The patient presents with a two-day-old injury to the first knuckle of her left hand, sustained from a puncture wound while using scissors. The wound is described as a 'tiny dot' that bled minimally, but the area is swollen, possibly warm to touch, and very sore. The patient has been icing the area and taking ibuprofen for pain relief.  In addition, the patient is experiencing symptoms she believes may be related to perimenopause. She reports regular but short and heavy menstrual periods, severe bloating, body-wide itching, and significant irritability, particularly in the week before menstruation. The patient is 47 years old and has been wondering when perimenopause might start for her. She also reports constipation, which she believes may be contributing to the bloating, and has been using collagen peptides to help with regularity.       Past Medical History:   Diagnosis Date   Active labor 12/24/2014   Active labor 12/24/2014   Anxiety    Depression    Fibroid    Post-term pregnancy, 40-42 weeks of gestation 12/24/2014    Past Surgical History:  Procedure Laterality Date   CESAREAN SECTION     CESAREAN SECTION N/A 12/25/2014   Procedure: CESAREAN SECTION;  Surgeon: Noland Fordyce, MD;  Location: [facility];  Service: Obstetrics;  Laterality: N/A;   TONSILLECTOMY      No family history on file.  Social History   Socioeconomic History   Marital status: Married    Spouse name: Not on file   Number of children: Not on file   Years of education: Not on file   Highest education level: Not on file  Occupational History   Not on file  Tobacco Use   Smoking status: Never   Smokeless tobacco: Never  Substance and Sexual Activity   Alcohol use: Yes    Comment: occas. prior to preg.   Drug use: No   Sexual activity: Yes  Other Topics Concern   Not on file  Social History Narrative   Not on file   Social Determinants of Health   Financial Resource Strain: Not on file  Food Insecurity: Not on file  Transportation Needs: Not on file  Physical Activity: Not on file  Stress: Not on file  Social Connections: Not on file  Intimate Partner Violence: Not on file    Outpatient Medications Prior to Visit  Medication Sig Dispense Refill  buPROPion (WELLBUTRIN XL) 300 MG 24 hr tablet Take 1 tablet (300 mg total) by mouth daily. 90 tablet 3   clobetasol (TEMOVATE) 0.05 % external solution Apply 1 Application topically 2 (two) times daily. 50 mL 0   fexofenadine-pseudoephedrine (ALLEGRA-D ALLERGY & CONGESTION) 180-240 MG 24 hr tablet Take 1 tablet by mouth daily. 30 tablet 2   fluticasone (FLONASE) 50 MCG/ACT nasal spray Place 2 sprays into both nostrils daily. 16 g 2   amphetamine-dextroamphetamine (ADDERALL) 20 MG tablet Take 1 tablet (20 mg total) by mouth in the morning, at noon, and at bedtime. 90 tablet 0   No facility-administered  medications prior to visit.    No Known Allergies  Review of Systems  Constitutional:  Negative for fever and malaise/fatigue.  HENT:  Negative for congestion.   Eyes:  Negative for blurred vision.  Respiratory:  Negative for shortness of breath.   Cardiovascular:  Negative for chest pain, palpitations and leg swelling.  Gastrointestinal:  Negative for abdominal pain, blood in stool and nausea.  Genitourinary:  Negative for dysuria and frequency.  Musculoskeletal:  Negative for falls.  Skin:  Negative for rash.  Neurological:  Negative for dizziness, loss of consciousness and headaches.  Endo/Heme/Allergies:  Negative for environmental allergies.  Psychiatric/Behavioral:  Negative for depression. The patient is not nervous/anxious.        Objective:    Physical Exam Vitals and nursing note reviewed.  Pulmonary:     Effort: Pulmonary effort is normal.     Breath sounds: Normal breath sounds.  Skin:    Findings: Erythema present.     Comments: L hand-- swelling thenar eminence and tender , errythema   Psychiatric:        Mood and Affect: Mood normal.        Behavior: Behavior normal.        Thought Content: Thought content normal.        Judgment: Judgment normal.     There were no vitals taken for this visit. Wt Readings from Last 3 Encounters:  08/02/22 160 lb 6.4 oz (72.8 kg)  07/18/22 163 lb 4 oz (74 kg)  01/17/21 164 lb 9.6 oz (74.7 kg)       Assessment & Plan:  Puncture wound of left hand without foreign body, initial encounter -     Doxycycline Hyclate; Take 1 tablet (100 mg total) by mouth 2 (two) times daily.  Dispense: 20 tablet; Refill: 0  Abdominal bloating  Colon cancer screening -     Ambulatory referral to Gastroenterology  Attention deficit disorder (ADD) in adult -     Amphetamine-Dextroamphetamine; Take 1 tablet (20 mg total) by mouth in the morning, at noon, and at bedtime.  Dispense: 90 tablet; Refill: 0 -     Amphetamine-Dextroamphetamine;  1 po tid  Dispense: 90 tablet; Refill: 0 -     Amphetamine-Dextroamphetamine; 1 po tid  Dispense: 90 tablet; Refill: 0     I discussed the assessment and treatment plan with the patient. The patient was provided an opportunity to ask questions and all were answered. The patient agreed with the plan and demonstrated an understanding of the instructions.   The patient was advised to call back or seek an in-person evaluation if the symptoms worsen or if the condition fails to improve as anticipated.  Donato Schultz, DO [facility] [phone] (phone) [phone] (fax)  [facility]

## 2023-07-08 ENCOUNTER — Other Ambulatory Visit: Payer: Self-pay | Admitting: Family Medicine

## 2023-07-08 DIAGNOSIS — F988 Other specified behavioral and emotional disorders with onset usually occurring in childhood and adolescence: Secondary | ICD-10-CM

## 2023-07-08 MED ORDER — AMPHETAMINE-DEXTROAMPHETAMINE 20 MG PO TABS
ORAL_TABLET | ORAL | 0 refills | Status: DC
Start: 2023-07-08 — End: 2023-08-28

## 2023-07-08 NOTE — Telephone Encounter (Signed)
Dr. Laury Axon sent refills for Adderall 20mg  for 04/23/2023, 05/24/2023 and 07/24/2023 but not 06/24/2023. Can you send prescription for 06/2023?

## 2023-07-29 ENCOUNTER — Encounter (HOSPITAL_BASED_OUTPATIENT_CLINIC_OR_DEPARTMENT_OTHER): Payer: 59 | Admitting: Radiology

## 2023-07-29 DIAGNOSIS — Z1231 Encounter for screening mammogram for malignant neoplasm of breast: Secondary | ICD-10-CM

## 2023-08-14 ENCOUNTER — Other Ambulatory Visit: Payer: Self-pay

## 2023-08-14 DIAGNOSIS — F988 Other specified behavioral and emotional disorders with onset usually occurring in childhood and adolescence: Secondary | ICD-10-CM

## 2023-08-28 ENCOUNTER — Other Ambulatory Visit: Payer: Self-pay | Admitting: Family Medicine

## 2023-08-28 ENCOUNTER — Encounter: Payer: Self-pay | Admitting: Family Medicine

## 2023-08-28 DIAGNOSIS — L409 Psoriasis, unspecified: Secondary | ICD-10-CM

## 2023-08-28 DIAGNOSIS — F988 Other specified behavioral and emotional disorders with onset usually occurring in childhood and adolescence: Secondary | ICD-10-CM

## 2023-08-29 MED ORDER — CLOBETASOL PROPIONATE 0.05 % EX SOLN: 1.0000 | Freq: Two times a day (BID) | CUTANEOUS | 0 refills | Status: DC

## 2023-08-29 MED ORDER — BUPROPION HCL ER (XL) 300 MG PO TB24: 300.0000 mg | ORAL_TABLET | Freq: Every day | ORAL | 0 refills | Status: DC

## 2023-08-29 MED ORDER — AMPHETAMINE-DEXTROAMPHETAMINE 20 MG PO TABS: ORAL_TABLET | ORAL | 0 refills | Status: DC

## 2023-08-29 NOTE — Telephone Encounter (Signed)
Requesting:Adderall 20mg   Contract: 07/18/22 UDS: 07/18/22 Last Visit: 04/23/23 Next Visit: None Last Refill: 07/08/23 #90 and 0RF    Please Advise

## 2023-09-04 ENCOUNTER — Inpatient Hospital Stay (HOSPITAL_BASED_OUTPATIENT_CLINIC_OR_DEPARTMENT_OTHER): Admission: RE | Admit: 2023-09-04 | Payer: 59 | Source: Ambulatory Visit | Admitting: Radiology

## 2023-09-04 DIAGNOSIS — Z1231 Encounter for screening mammogram for malignant neoplasm of breast: Secondary | ICD-10-CM

## 2023-09-11 ENCOUNTER — Telehealth: Payer: 59 | Admitting: Physician Assistant

## 2023-09-11 DIAGNOSIS — B9689 Other specified bacterial agents as the cause of diseases classified elsewhere: Secondary | ICD-10-CM | POA: Diagnosis not present

## 2023-09-11 DIAGNOSIS — J019 Acute sinusitis, unspecified: Secondary | ICD-10-CM

## 2023-09-11 MED ORDER — PREDNISONE 20 MG PO TABS: 40.0000 mg | ORAL_TABLET | Freq: Every day | ORAL | 0 refills | Status: DC

## 2023-09-11 MED ORDER — AMOXICILLIN-POT CLAVULANATE 875-125 MG PO TABS: 1.0000 | ORAL_TABLET | Freq: Two times a day (BID) | ORAL | 0 refills | Status: DC

## 2023-09-11 NOTE — Patient Instructions (Signed)
Desiree Reese, thank you for joining Margaretann Loveless, PA-C for today's virtual visit.  While this provider is not your primary care provider (PCP), if your PCP is located in our provider database this encounter information will be shared with them immediately following your visit.   A [facility] MyChart account gives you access to today's visit and all your visits, tests, and labs performed at [facility] " click here if you don't have a [facility] MyChart account or go to [email or web address]  Consent: (Patient) Desiree Reese provided verbal consent for this virtual visit at the beginning of the encounter.  Current Medications:  Current Outpatient Medications:    amoxicillin-clavulanate (AUGMENTIN) 875-125 MG tablet, Take 1 tablet by mouth 2 (two) times daily., Disp: 20 tablet, Rfl: 0   predniSONE (DELTASONE) 20 MG tablet, Take 2 tablets (40 mg total) by mouth daily with breakfast., Disp: 10 tablet, Rfl: 0   amphetamine-dextroamphetamine (ADDERALL) 20 MG tablet, Take 1 tablet (20 mg total) by mouth in the morning, at noon, and at bedtime., Disp: 90 tablet, Rfl: 0   amphetamine-dextroamphetamine (ADDERALL) 20 MG tablet, 1 po tid, Disp: 90 tablet, Rfl: 0   amphetamine-dextroamphetamine (ADDERALL) 20 MG tablet, 1 po tid, Disp: 90 tablet, Rfl: 0   buPROPion (WELLBUTRIN XL) 300 MG 24 hr tablet, Take 1 tablet (300 mg total) by mouth daily., Disp: 90 tablet, Rfl: 0   clobetasol (TEMOVATE) 0.05 % external solution, Apply 1 Application topically 2 (two) times daily., Disp: 50 mL, Rfl: 0   fexofenadine-pseudoephedrine (ALLEGRA-D ALLERGY & CONGESTION) 180-240 MG 24 hr tablet, Take 1 tablet by mouth daily., Disp: 30 tablet, Rfl: 2   fluticasone (FLONASE) 50 MCG/ACT nasal spray, Place 2 sprays into both nostrils daily., Disp: 16 g, Rfl: 2   Medications ordered in this encounter:  Meds ordered this encounter  Medications   amoxicillin-clavulanate (AUGMENTIN) 875-125 MG  tablet    Sig: Take 1 tablet by mouth 2 (two) times daily.    Dispense:  20 tablet    Refill:  0    Order Specific Question:   Supervising Provider    Answer:   Merrilee Jansky [[id number]]   predniSONE (DELTASONE) 20 MG tablet    Sig: Take 2 tablets (40 mg total) by mouth daily with breakfast.    Dispense:  10 tablet    Refill:  0    Order Specific Question:   Supervising Provider    Answer:   Merrilee Jansky [[id number]]     *If you need refills on other medications prior to your next appointment, please contact your pharmacy*  Follow-Up: Call back or seek an in-person evaluation if the symptoms worsen or if the condition fails to improve as anticipated.  [facility] [phone]  Other Instructions Sinus Infection, Adult A sinus infection, also called sinusitis, is inflammation of your sinuses. Sinuses are hollow spaces in the bones around your face. Your sinuses are located: Around your eyes. In the middle of your forehead. Behind your nose. In your cheekbones. Mucus normally drains out of your sinuses. When your nasal tissues become inflamed or swollen, mucus can become trapped or blocked. This allows bacteria, viruses, and fungi to grow, which leads to infection. Most infections of the sinuses are caused by a virus. A sinus infection can develop quickly. It can last for up to 4 weeks (acute) or for more than 12 weeks (chronic). A sinus infection often develops after a cold. What are the causes? This  condition is caused by anything that creates swelling in the sinuses or stops mucus from draining. This includes: Allergies. Asthma. Infection from bacteria or viruses. Deformities or blockages in your nose or sinuses. Abnormal growths in the nose (nasal polyps). Pollutants, such as chemicals or irritants in the air. Infection from fungi. This is rare. What increases the risk? You are more likely to develop this condition if you: Have a weak body defense system  (immune system). Do a lot of swimming or diving. Overuse nasal sprays. Smoke. What are the signs or symptoms? The main symptoms of this condition are pain and a feeling of pressure around the affected sinuses. Other symptoms include: Stuffy nose or congestion that makes it difficult to breathe through your nose. Thick yellow or greenish drainage from your nose. Tenderness, swelling, and warmth over the affected sinuses. A cough that may get worse at night. Decreased sense of smell and taste. Extra mucus that collects in the throat or the back of the nose (postnasal drip) causing a sore throat or bad breath. Tiredness (fatigue). Fever. How is this diagnosed? This condition is diagnosed based on: Your symptoms. Your medical history. A physical exam. Tests to find out if your condition is acute or chronic. This may include: Checking your nose for nasal polyps. Viewing your sinuses using a device that has a light (endoscope). Testing for allergies or bacteria. Imaging tests, such as an MRI or CT scan. In rare cases, a bone biopsy may be done to rule out more serious types of fungal sinus disease. How is this treated? Treatment for a sinus infection depends on the cause and whether your condition is chronic or acute. If caused by a virus, your symptoms should go away on their own within 10 days. You may be given medicines to relieve symptoms. They include: Medicines that shrink swollen nasal passages (decongestants). A spray that eases inflammation of the nostrils (topical intranasal corticosteroids). Rinses that help get rid of thick mucus in your nose (nasal saline washes). Medicines that treat allergies (antihistamines). Over-the-counter pain relievers. If caused by bacteria, your health care provider may recommend waiting to see if your symptoms improve. Most bacterial infections will get better without antibiotic medicine. You may be given antibiotics if you have: A severe  infection. A weak immune system. If caused by narrow nasal passages or nasal polyps, surgery may be needed. Follow these instructions at home: Medicines Take, use, or apply over-the-counter and prescription medicines only as told by your health care provider. These may include nasal sprays. If you were prescribed an antibiotic medicine, take it as told by your health care provider. Do not stop taking the antibiotic even if you start to feel better. Hydrate and humidify  Drink enough fluid to keep your urine pale yellow. Staying hydrated will help to thin your mucus. Use a cool mist humidifier to keep the humidity level in your home above 50%. Inhale steam for 10-15 minutes, 3-4 times a day, or as told by your health care provider. You can do this in the bathroom while a hot shower is running. Limit your exposure to cool or dry air. Rest Rest as much as possible. Sleep with your head raised (elevated). Make sure you get enough sleep each night. General instructions  Apply a warm, moist washcloth to your face 3-4 times a day or as told by your health care provider. This will help with discomfort. Use nasal saline washes as often as told by your health care provider. Wash your  hands often with soap and water to reduce your exposure to germs. If soap and water are not available, use hand sanitizer. Do not smoke. Avoid being around people who are smoking (secondhand smoke). Keep all follow-up visits. This is important. Contact a health care provider if: You have a fever. Your symptoms get worse. Your symptoms do not improve within 10 days. Get help right away if: You have a severe headache. You have persistent vomiting. You have severe pain or swelling around your face or eyes. You have vision problems. You develop confusion. Your neck is stiff. You have trouble breathing. These symptoms may be an emergency. Get help right away. Call 911. Do not wait to see if the symptoms will go  away. Do not drive yourself to the hospital. Summary A sinus infection is soreness and inflammation of your sinuses. Sinuses are hollow spaces in the bones around your face. This condition is caused by nasal tissues that become inflamed or swollen. The swelling traps or blocks the flow of mucus. This allows bacteria, viruses, and fungi to grow, which leads to infection. If you were prescribed an antibiotic medicine, take it as told by your health care provider. Do not stop taking the antibiotic even if you start to feel better. Keep all follow-up visits. This is important. This information is not intended to replace advice given to you by your health care provider. Make sure you discuss any questions you have with your health care provider. Document Revised: 09/05/2021 Document Reviewed: 09/05/2021 Elsevier Patient Education  2024 [employer]    If you have been instructed to have an in-person evaluation today at a local Urgent Care facility, please use the link below. It will take you to a list of all of our available [facility], including address, phone number and hours of operation. Please do not delay care.  [facility]  If you or a family member do not have a primary care provider, use the link below to schedule a visit and establish care. When you choose a [facility] primary care physician or advanced practice provider, you gain a long-term partner in health. Find a Primary Care Provider  Learn more about [facility]'s in-office and virtual care options:  - Get Care Now

## 2023-09-11 NOTE — Progress Notes (Signed)
Virtual Visit Consent   [name], you are scheduled for a virtual visit with a  provider today. Just as with appointments in the office, your consent must be obtained to participate. Your consent will be active for this visit and any virtual visit you may have with one of our providers in the next 365 days. If you have a MyChart account, a copy of this consent can be sent to you electronically.  As this is a virtual visit, video technology does not allow for your provider to perform a traditional examination. This may limit your provider's ability to fully assess your condition. If your provider identifies any concerns that need to be evaluated in person or the need to arrange testing (such as labs, EKG, etc.), we will make arrangements to do so. Although advances in technology are sophisticated, we cannot ensure that it will always work on either your end or our end. If the connection with a video visit is poor, the visit may have to be switched to a telephone visit. With either a video or telephone visit, we are not always able to ensure that we have a secure connection.  By engaging in this virtual visit, you consent to the provision of healthcare and authorize for your insurance to be billed (if applicable) for the services provided during this visit. Depending on your insurance coverage, you may receive a charge related to this service.  I need to obtain your verbal consent now. Are you willing to proceed with your visit today? ANH WINFREY has provided verbal consent on 09/11/2023 for a virtual visit (video or telephone). Margaretann Loveless, PA-C  Date: 09/11/2023 9:59 AM  Virtual Visit via Video Note   I, Margaretann Loveless, connected with  Desiree Reese  ([id number], Oct 09, 1976) on 09/11/23 at  9:45 AM EST by a video-enabled telemedicine application and verified that I am speaking with the correct person using two identifiers.  Location: Patient: Virtual  Visit Location Patient: Home Provider: Virtual Visit Location Provider: Home Office   I discussed the limitations of evaluation and management by telemedicine and the availability of in person appointments. The patient expressed understanding and agreed to proceed.    History of Present Illness: Desiree Reese is a 47 y.o. who identifies as a female who was assigned female at birth, and is being seen today for possible sinus infection.  HPI: URI  This is a new problem. The current episode started 1 to 4 weeks ago. The problem has been gradually worsening. There has been no fever. Associated symptoms include congestion, coughing, headaches, a plugged ear sensation, rhinorrhea (and post nasal drainage), sinus pain, a sore throat (initially) and swollen glands. Pertinent negatives include no diarrhea, ear pain, nausea, neck pain, vomiting or wheezing. Treatments tried: allegra-d, robitussin, mucinex sinus max, flonase. The treatment provided no relief.  Covid 19 testing at home was negative    Problems:  Patient Active Problem List   Diagnosis Date Noted   Gynecologic exam normal 01/17/2021   Depression with anxiety 07/01/2020   Psoriasis 06/30/2020   Attention deficit disorder (ADD) in adult 06/30/2020   Postpartum care following cesarean delivery (3/12) 12/25/2014   Active labor 12/24/2014   Post-term pregnancy, 40-42 weeks of gestation 12/24/2014   Normal labor and delivery 12/24/2014   Abdominal pain affecting pregnancy, antepartum    [redacted] weeks gestation of pregnancy     Allergies: No Known Allergies Medications:  Current Outpatient Medications:    amoxicillin-clavulanate (AUGMENTIN)  875-125 MG tablet, Take 1 tablet by mouth 2 (two) times daily., Disp: 20 tablet, Rfl: 0   amphetamine-dextroamphetamine (ADDERALL) 20 MG tablet, Take 1 tablet (20 mg total) by mouth in the morning, at noon, and at bedtime., Disp: 90 tablet, Rfl: 0   amphetamine-dextroamphetamine (ADDERALL) 20 MG  tablet, 1 po tid, Disp: 90 tablet, Rfl: 0   amphetamine-dextroamphetamine (ADDERALL) 20 MG tablet, 1 po tid, Disp: 90 tablet, Rfl: 0   buPROPion (WELLBUTRIN XL) 300 MG 24 hr tablet, Take 1 tablet (300 mg total) by mouth daily., Disp: 90 tablet, Rfl: 0   clobetasol (TEMOVATE) 0.05 % external solution, Apply 1 Application topically 2 (two) times daily., Disp: 50 mL, Rfl: 0   fexofenadine-pseudoephedrine (ALLEGRA-D ALLERGY & CONGESTION) 180-240 MG 24 hr tablet, Take 1 tablet by mouth daily., Disp: 30 tablet, Rfl: 2   fluticasone (FLONASE) 50 MCG/ACT nasal spray, Place 2 sprays into both nostrils daily., Disp: 16 g, Rfl: 2   predniSONE (DELTASONE) 20 MG tablet, Take 2 tablets (40 mg total) by mouth daily with breakfast., Disp: 10 tablet, Rfl: 0  Observations/Objective: Patient is well-developed, well-nourished in no acute distress.  Resting comfortably at home.  Head is normocephalic, atraumatic.  No labored breathing.  Speech is clear and coherent with logical content.  Patient is alert and oriented at baseline.    Assessment and Plan: 1. Acute bacterial sinusitis - amoxicillin-clavulanate (AUGMENTIN) 875-125 MG tablet; Take 1 tablet by mouth 2 (two) times daily.  Dispense: 20 tablet; Refill: 0 - predniSONE (DELTASONE) 20 MG tablet; Take 2 tablets (40 mg total) by mouth daily with breakfast.  Dispense: 10 tablet; Refill: 0  - Worsening symptoms that have not responded to OTC medications.  - Will give Augmentin and Prednisone - Continue allergy medications.  - Steam and humidifier can help - Stay well hydrated and get plenty of rest.  - Seek in person evaluation if no symptom improvement or if symptoms worsen   Follow Up Instructions: I discussed the assessment and treatment plan with the patient. The patient was provided an opportunity to ask questions and all were answered. The patient agreed with the plan and demonstrated an understanding of the instructions.  A copy of instructions  were sent to the patient via MyChart unless otherwise noted below.    The patient was advised to call back or seek an in-person evaluation if the symptoms worsen or if the condition fails to improve as anticipated.    Margaretann Loveless, PA-C

## 2023-09-18 ENCOUNTER — Inpatient Hospital Stay (HOSPITAL_BASED_OUTPATIENT_CLINIC_OR_DEPARTMENT_OTHER): Admission: RE | Admit: 2023-09-18 | Payer: 59 | Source: Ambulatory Visit | Admitting: Radiology

## 2023-09-18 DIAGNOSIS — Z1231 Encounter for screening mammogram for malignant neoplasm of breast: Secondary | ICD-10-CM

## 2023-10-04 ENCOUNTER — Other Ambulatory Visit: Payer: Self-pay | Admitting: Family Medicine

## 2023-10-04 DIAGNOSIS — F988 Other specified behavioral and emotional disorders with onset usually occurring in childhood and adolescence: Secondary | ICD-10-CM

## 2023-10-04 MED ORDER — AMPHETAMINE-DEXTROAMPHETAMINE 20 MG PO TABS: ORAL_TABLET | ORAL | 0 refills | Status: DC

## 2023-10-04 NOTE — Telephone Encounter (Signed)
Requesting: Adderall 20mg   Contract: 07/18/22 UDS: 07/18/22 Last Visit: 04/23/23 Next Visit: None Last Refill: 08/29/23 #90 and 0RF   Please Advise

## 2023-10-22 ENCOUNTER — Other Ambulatory Visit (HOSPITAL_COMMUNITY): Payer: Self-pay

## 2023-11-07 ENCOUNTER — Inpatient Hospital Stay (HOSPITAL_BASED_OUTPATIENT_CLINIC_OR_DEPARTMENT_OTHER): Admission: RE | Admit: 2023-11-07 | Payer: 59 | Source: Ambulatory Visit | Admitting: Radiology

## 2023-11-07 ENCOUNTER — Encounter (HOSPITAL_BASED_OUTPATIENT_CLINIC_OR_DEPARTMENT_OTHER): Payer: Self-pay

## 2023-11-07 DIAGNOSIS — Z1231 Encounter for screening mammogram for malignant neoplasm of breast: Secondary | ICD-10-CM

## 2023-11-19 ENCOUNTER — Encounter: Payer: Self-pay | Admitting: Family Medicine

## 2023-11-19 ENCOUNTER — Other Ambulatory Visit: Payer: Self-pay | Admitting: Family Medicine

## 2023-11-19 DIAGNOSIS — F988 Other specified behavioral and emotional disorders with onset usually occurring in childhood and adolescence: Secondary | ICD-10-CM

## 2023-11-28 ENCOUNTER — Other Ambulatory Visit: Payer: Self-pay | Admitting: Family Medicine

## 2023-11-28 DIAGNOSIS — F988 Other specified behavioral and emotional disorders with onset usually occurring in childhood and adolescence: Secondary | ICD-10-CM

## 2023-12-24 ENCOUNTER — Ambulatory Visit: Admitting: Family Medicine

## 2023-12-31 ENCOUNTER — Encounter: Payer: Self-pay | Admitting: Family Medicine

## 2023-12-31 ENCOUNTER — Ambulatory Visit: Admitting: Family Medicine

## 2023-12-31 VITALS — BP 118/90 | HR 113 | Temp 97.8°F | Resp 16 | Ht 64.0 in | Wt 176.4 lb

## 2023-12-31 DIAGNOSIS — F988 Other specified behavioral and emotional disorders with onset usually occurring in childhood and adolescence: Secondary | ICD-10-CM

## 2023-12-31 DIAGNOSIS — L409 Psoriasis, unspecified: Secondary | ICD-10-CM

## 2023-12-31 DIAGNOSIS — Z1211 Encounter for screening for malignant neoplasm of colon: Secondary | ICD-10-CM | POA: Diagnosis not present

## 2023-12-31 MED ORDER — AMPHETAMINE-DEXTROAMPHETAMINE 20 MG PO TABS: ORAL_TABLET | ORAL | 0 refills | Status: DC

## 2023-12-31 MED ORDER — BUPROPION HCL ER (XL) 300 MG PO TB24: 300.0000 mg | ORAL_TABLET | Freq: Every day | ORAL | 3 refills | Status: DC

## 2023-12-31 MED ORDER — CLOBETASOL PROPIONATE 0.05 % EX SOLN: 1.0000 | Freq: Two times a day (BID) | CUTANEOUS | 1 refills | Status: DC

## 2023-12-31 MED ORDER — OZEMPIC (0.25 OR 0.5 MG/DOSE) 2 MG/3ML ~~LOC~~ SOPN: PEN_INJECTOR | SUBCUTANEOUS | Status: AC

## 2023-12-31 NOTE — Progress Notes (Signed)
 Established Patient Office Visit  Subjective   Patient ID: Desiree Reese, female    DOB: [birth year]  Age: 48 y.o. MRN: [id number]  Chief Complaint  Patient presents with   ADD   Follow-up    HPI Discussed the use of AI scribe software for clinical note transcription with the patient, who gave verbal consent to proceed.  History of Present Illness   Desiree Reese is a 48 year old female who presents for follow-up on ADD and medication refills.  She is managing her attention deficit disorder (ADD) and is inquiring about refills for her medications, specifically Wellbutrin (bupropion) and a scalp tonic, clobetasol, which she uses for scalp itchiness. She is unsure if her prescriptions have expired.  She may not have been using the scalp tonic consistently, as she notices increased itchiness when she does not use it.  She has been using a compounded semaglutide prescription for weight management for three weeks at a dose of 0.25 mg. She reports a significant reduction in 'food noise' and cravings, particularly for alcohol, and has not consumed any alcoholic beverages in three weeks.  She has not had a colonoscopy yet and is due for one. She prioritized her husband's colonoscopy, which was completed a few months ago. Her last physical, including a Pap smear, was approximately a year and a half ago. She is due for a tetanus shot, as her last one was likely during her pregnancy nine years ago.      Patient Active Problem List   Diagnosis Date Noted   Gynecologic exam normal 01/17/2021   Depression with anxiety 07/01/2020   Psoriasis 06/30/2020   Attention deficit disorder (ADD) in adult 06/30/2020   Postpartum care following cesarean delivery (3/12) 12/25/2014   Active labor 12/24/2014   Post-term pregnancy, 40-42 weeks of gestation 12/24/2014   Normal labor and delivery 12/24/2014   Abdominal pain affecting pregnancy, antepartum    [redacted] weeks gestation of pregnancy    Past  Medical History:  Diagnosis Date   Active labor 12/24/2014   Active labor 12/24/2014   Anxiety    Depression    Fibroid    Post-term pregnancy, 40-42 weeks of gestation 12/24/2014   Past Surgical History:  Procedure Laterality Date   CESAREAN SECTION     CESAREAN SECTION N/A 12/25/2014   Procedure: CESAREAN SECTION;  Surgeon: Noland Fordyce, MD;  Location: [facility];  Service: Obstetrics;  Laterality: N/A;   TONSILLECTOMY     Social History   Tobacco Use   Smoking status: Never   Smokeless tobacco: Never  Substance Use Topics   Alcohol use: Yes    Comment: occas. prior to preg.   Drug use: No   Social History   Socioeconomic History   Marital status: Married    Spouse name: Not on file   Number of children: Not on file   Years of education: Not on file   Highest education level: Bachelor's degree (e.g., BA, AB, BS)  Occupational History   Not on file  Tobacco Use   Smoking status: Never   Smokeless tobacco: Never  Substance and Sexual Activity   Alcohol use: Yes    Comment: occas. prior to preg.   Drug use: No   Sexual activity: Yes  Other Topics Concern   Not on file  Social History Narrative   Not on file   Social Drivers of Health   Financial Resource Strain: Low Risk  (12/23/2023)   Overall Financial Resource Strain (CARDIA)  Difficulty of Paying Living Expenses: Not hard at all  Food Insecurity: No Food Insecurity (12/23/2023)   Hunger Vital Sign    Worried About Running Out of Food in the Last Year: Never true    Ran Out of Food in the Last Year: Never true  Transportation Needs: No Transportation Needs (12/23/2023)   PRAPARE - Administrator, Civil Service (Medical): No    Lack of Transportation (Non-Medical): No  Physical Activity: Unknown (12/23/2023)   Exercise Vital Sign    Days of Exercise per Week: 0 days    Minutes of Exercise per Session: Not on file  Stress: Stress Concern Present (12/23/2023)   [employer] of Occupational  Health - Occupational Stress Questionnaire    Feeling of Stress : Rather much  Social Connections: Unknown (12/23/2023)   Social Connection and Isolation Panel [NHANES]    Frequency of Communication with Friends and Family: More than three times a week    Frequency of Social Gatherings with Friends and Family: Patient declined    Attends Religious Services: Never    Database administrator or Organizations: Patient declined    Attends Engineer, structural: Not on file    Marital Status: Married  Catering manager Violence: Not on file   No family status information on file.   No family history on file. No Known Allergies    Review of Systems  Constitutional:  Negative for fever and malaise/fatigue.  HENT:  Negative for congestion.   Eyes:  Negative for blurred vision.  Respiratory:  Negative for shortness of breath.   Cardiovascular:  Negative for chest pain, palpitations and leg swelling.  Gastrointestinal:  Negative for abdominal pain, blood in stool and nausea.  Genitourinary:  Negative for dysuria and frequency.  Musculoskeletal:  Negative for falls.  Skin:  Negative for rash.  Neurological:  Negative for dizziness, loss of consciousness and headaches.  Endo/Heme/Allergies:  Negative for environmental allergies.  Psychiatric/Behavioral:  Negative for depression. The patient is not nervous/anxious.       Objective:     BP (!) 118/90 (BP Location: Right Arm, Patient Position: Sitting, Cuff Size: Normal)   Pulse (!) 113   Temp 97.8 F (36.6 C) (Oral)   Resp 16   Ht 5\' 4"  (1.626 m)   Wt 176 lb 6.4 oz (80 kg)   SpO2 98%   BMI 30.28 kg/m  BP Readings from Last 3 Encounters:  12/31/23 (!) 118/90  08/02/22 110/80  07/18/22 120/80   Wt Readings from Last 3 Encounters:  12/31/23 176 lb 6.4 oz (80 kg)  08/02/22 160 lb 6.4 oz (72.8 kg)  07/18/22 163 lb 4 oz (74 kg)   SpO2 Readings from Last 3 Encounters:  12/31/23 98%  08/02/22 98%  07/18/22 98%       Physical Exam Vitals and nursing note reviewed.  Constitutional:      General: She is not in acute distress.    Appearance: Normal appearance. She is well-developed.  HENT:     Head: Normocephalic and atraumatic.  Eyes:     General: No scleral icterus.       Right eye: No discharge.        Left eye: No discharge.  Cardiovascular:     Rate and Rhythm: Normal rate and regular rhythm.     Heart sounds: No murmur heard. Pulmonary:     Effort: Pulmonary effort is normal. No respiratory distress.     Breath sounds: Normal breath sounds.  Musculoskeletal:        General: Normal range of motion.     Cervical back: Normal range of motion and neck supple.     Right lower leg: No edema.     Left lower leg: No edema.  Skin:    General: Skin is warm and dry.  Neurological:     Mental Status: She is alert and oriented to person, place, and time.  Psychiatric:        Mood and Affect: Mood normal.        Behavior: Behavior normal.        Thought Content: Thought content normal.        Judgment: Judgment normal.      No results found for any visits on 12/31/23.  Last CBC Lab Results  Component Value Date   WBC 9.3 10/20/2020   HGB 13.9 10/20/2020   HCT 40.9 10/20/2020   MCV 91.2 10/20/2020   MCH 31.3 12/26/2014   RDW 13.0 10/20/2020   PLT 286.0 10/20/2020   Last metabolic panel Lab Results  Component Value Date   GLUCOSE 100 (H) 10/20/2020   NA 140 10/20/2020   K 3.7 10/20/2020   CL 104 10/20/2020   CO2 30 10/20/2020   BUN 16 10/20/2020   CREATININE 0.80 10/20/2020   GFR 90.55 10/20/2020   CALCIUM 9.6 10/20/2020   PROT 6.9 10/20/2020   ALBUMIN 4.5 10/20/2020   BILITOT 0.4 10/20/2020   ALKPHOS 77 10/20/2020   AST 11 10/20/2020   ALT 10 10/20/2020   Last lipids Lab Results  Component Value Date   CHOL 197 10/20/2020   HDL 74.90 10/20/2020   LDLCALC 105 (H) 10/20/2020   TRIG 84.0 10/20/2020   CHOLHDL 3 10/20/2020   Last hemoglobin A1c No results found for:  "HGBA1C" Last thyroid functions Lab Results  Component Value Date   TSH 3.78 10/20/2020   Last vitamin D No results found for: "25OHVITD2", "25OHVITD3", "VD25OH" Last vitamin B12 and Folate No results found for: "VITAMINB12", "FOLATE"    The ASCVD Risk score (Arnett DK, et al., 2019) failed to calculate for the following reasons:   Cannot find a previous HDL lab   Cannot find a previous total cholesterol lab    Assessment & Plan:  Assessment and Plan    Attention Deficit Disorder (ADD)   Symptoms are effectively managed with Adderall. Refill Adderall prescription at [facility].  Depression   Currently on bupropion (Wellbutrin) with no reported issues. Refill bupropion prescription.  Scalp Dermatitis   Experiences itchiness and flares due to inconsistent use of clobetasol scalp cream. Refill clobetasol cream.  Weight Management   Using compounded semaglutide 0.25 mg for three weeks, reporting decreased food noise and alcohol cravings. FDA has revoked compounding semaglutide. Discussed switching to Lilly's generic semaglutide, which is more expensive but available for home delivery. Consider switching to Lilly's generic semaglutide for $399 for the lowest dose, delivered to the house.  General Health Maintenance   Overdue for colonoscopy and tetanus vaccination. Last tetanus shot likely over ten years ago. Had a physical in 2022, including a Pap smear, not due for another yet. Schedule colonoscopy and tetanus vaccination. Schedule physical examination for the next visit.  Follow-up   Urine drug screen and controlled substance agreement need updating, not updated in two years. Update urine drug screen and controlled substance agreement.       Problem List Items Addressed This Visit       Unprioritized   Psoriasis  Attention deficit disorder (ADD) in adult    No follow-ups on file.    Donato Schultz, DO

## 2024-01-05 LAB — DRUG MONITORING PANEL 376104, URINE
Amphetamine: 4082 ng/mL — ABNORMAL HIGH (ref <250)
Amphetamines: POSITIVE ng/mL — AB (ref <500)
Barbiturates: NEGATIVE ng/mL (ref <300)
Benzodiazepines: NEGATIVE ng/mL (ref <100)
Cocaine Metabolite: NEGATIVE ng/mL (ref <150)
Desmethyltramadol: NEGATIVE ng/mL (ref <100)
Methamphetamine: NEGATIVE ng/mL (ref <250)
Opiates: NEGATIVE ng/mL (ref <100)
Oxycodone: NEGATIVE ng/mL (ref <100)
Tramadol: NEGATIVE ng/mL (ref <100)

## 2024-01-05 LAB — DM TEMPLATE

## 2024-01-15 DIAGNOSIS — Z1211 Encounter for screening for malignant neoplasm of colon: Secondary | ICD-10-CM | POA: Insufficient documentation

## 2024-01-21 ENCOUNTER — Other Ambulatory Visit: Payer: Self-pay | Admitting: Family Medicine

## 2024-01-21 DIAGNOSIS — L409 Psoriasis, unspecified: Secondary | ICD-10-CM

## 2024-02-06 ENCOUNTER — Other Ambulatory Visit: Payer: Self-pay | Admitting: Family Medicine

## 2024-02-06 DIAGNOSIS — F988 Other specified behavioral and emotional disorders with onset usually occurring in childhood and adolescence: Secondary | ICD-10-CM

## 2024-02-06 MED ORDER — AMPHETAMINE-DEXTROAMPHETAMINE 20 MG PO TABS: ORAL_TABLET | ORAL | 0 refills | Status: DC

## 2024-02-06 NOTE — Telephone Encounter (Signed)
 Requesting: Adderall 20mg   Contract: 12/31/23 UDS: 12/31/23 Last Visit: 12/31/23 Next Visit: None Last Refill: 12/31/23 #90 and 0RF   Please Advise

## 2024-03-18 ENCOUNTER — Encounter: Payer: Self-pay | Admitting: Family Medicine

## 2024-03-24 ENCOUNTER — Encounter: Payer: Self-pay | Admitting: Family Medicine

## 2024-03-24 DIAGNOSIS — F988 Other specified behavioral and emotional disorders with onset usually occurring in childhood and adolescence: Secondary | ICD-10-CM

## 2024-03-24 MED ORDER — AMPHETAMINE-DEXTROAMPHETAMINE 20 MG PO TABS
20.0000 mg | ORAL_TABLET | Freq: Three times a day (TID) | ORAL | 0 refills | Status: DC
Start: 2024-03-24 — End: 2024-04-27

## 2024-03-24 NOTE — Telephone Encounter (Signed)
 Requesting: Adderall 20mg   Contract: 12/31/23 UDS: 12/31/23 Last Visit: 12/31/23 Next Visit: None Last Refill: 02/06/24 #90 and 0RF   Please Advise

## 2024-04-25 ENCOUNTER — Encounter: Payer: Self-pay | Admitting: Family Medicine

## 2024-04-25 DIAGNOSIS — L409 Psoriasis, unspecified: Secondary | ICD-10-CM

## 2024-04-25 DIAGNOSIS — F988 Other specified behavioral and emotional disorders with onset usually occurring in childhood and adolescence: Secondary | ICD-10-CM

## 2024-04-27 MED ORDER — AMPHETAMINE-DEXTROAMPHETAMINE 20 MG PO TABS: 20.0000 mg | ORAL_TABLET | Freq: Three times a day (TID) | ORAL | 0 refills | Status: DC

## 2024-04-27 MED ORDER — CLOBETASOL PROPIONATE 0.05 % EX SOLN: Freq: Two times a day (BID) | CUTANEOUS | 1 refills | Status: DC

## 2024-04-27 NOTE — Telephone Encounter (Signed)
 Requesting: Adderall Contract: 12/31/23 UDS: 12/31/23 Last OV: 12/31/2023 Next OV: n/a Last Refill: 03/24/24, #90--0 RF Database:   Please advise

## 2024-06-18 ENCOUNTER — Encounter: Payer: Self-pay | Admitting: Family Medicine

## 2024-06-18 DIAGNOSIS — L409 Psoriasis, unspecified: Secondary | ICD-10-CM

## 2024-06-18 DIAGNOSIS — F988 Other specified behavioral and emotional disorders with onset usually occurring in childhood and adolescence: Secondary | ICD-10-CM

## 2024-06-19 ENCOUNTER — Other Ambulatory Visit: Payer: Self-pay | Admitting: Family Medicine

## 2024-06-19 DIAGNOSIS — F988 Other specified behavioral and emotional disorders with onset usually occurring in childhood and adolescence: Secondary | ICD-10-CM

## 2024-06-19 DIAGNOSIS — L409 Psoriasis, unspecified: Secondary | ICD-10-CM

## 2024-06-19 MED ORDER — CLOBETASOL PROPIONATE 0.05 % EX SOLN
Freq: Two times a day (BID) | CUTANEOUS | 1 refills | Status: DC
Start: 2024-06-19 — End: 2024-06-22

## 2024-06-19 MED ORDER — AMPHETAMINE-DEXTROAMPHETAMINE 20 MG PO TABS
20.0000 mg | ORAL_TABLET | Freq: Three times a day (TID) | ORAL | 0 refills | Status: DC
Start: 2024-06-19 — End: 2024-06-22

## 2024-06-19 MED ORDER — CLOBETASOL PROPIONATE 0.05 % EX SOLN: Freq: Two times a day (BID) | CUTANEOUS | 1 refills | Status: DC

## 2024-06-19 NOTE — Telephone Encounter (Signed)
 Requesting: Adderall 20MG   Contract: 12/31/23 UDS: 12/31/23 Last Visit: 12/31/23 Next Visit: None Last Refill: 04/27/24 #90 AND 0rf  Please Advise

## 2024-06-22 MED ORDER — CLOBETASOL PROPIONATE 0.05 % EX SOLN
Freq: Two times a day (BID) | CUTANEOUS | 1 refills | Status: AC
Start: 2024-06-22 — End: ?

## 2024-06-22 MED ORDER — AMPHETAMINE-DEXTROAMPHETAMINE 20 MG PO TABS: 20.0000 mg | ORAL_TABLET | Freq: Three times a day (TID) | ORAL | 0 refills | Status: DC

## 2024-06-22 NOTE — Addendum Note (Signed)
 Addended by: ANTONIO CYNDEE ROCKERS R on: 06/22/2024 12:56 PM   Modules accepted: Orders

## 2024-07-16 ENCOUNTER — Other Ambulatory Visit (HOSPITAL_BASED_OUTPATIENT_CLINIC_OR_DEPARTMENT_OTHER): Payer: Self-pay | Admitting: Family Medicine

## 2024-07-16 DIAGNOSIS — Z1231 Encounter for screening mammogram for malignant neoplasm of breast: Secondary | ICD-10-CM

## 2024-07-17 ENCOUNTER — Ambulatory Visit: Admitting: Family Medicine

## 2024-07-17 ENCOUNTER — Ambulatory Visit (HOSPITAL_BASED_OUTPATIENT_CLINIC_OR_DEPARTMENT_OTHER)
Admission: RE | Admit: 2024-07-17 | Discharge: 2024-07-17 | Disposition: A | Discharge status: Home / Self Care | Payer: Self-pay | Source: Ambulatory Visit | Attending: Family Medicine | Admitting: Family Medicine

## 2024-07-17 ENCOUNTER — Encounter (HOSPITAL_BASED_OUTPATIENT_CLINIC_OR_DEPARTMENT_OTHER): Payer: Self-pay | Admitting: Radiology

## 2024-07-17 ENCOUNTER — Encounter: Payer: Self-pay | Admitting: Family Medicine

## 2024-07-17 VITALS — BP 118/84 | HR 121 | Temp 98.0°F | Resp 18 | Ht 64.0 in | Wt 156.4 lb

## 2024-07-17 DIAGNOSIS — Z1211 Encounter for screening for malignant neoplasm of colon: Secondary | ICD-10-CM

## 2024-07-17 DIAGNOSIS — Z1159 Encounter for screening for other viral diseases: Secondary | ICD-10-CM

## 2024-07-17 DIAGNOSIS — F988 Other specified behavioral and emotional disorders with onset usually occurring in childhood and adolescence: Secondary | ICD-10-CM | POA: Diagnosis not present

## 2024-07-17 DIAGNOSIS — Z23 Encounter for immunization: Secondary | ICD-10-CM

## 2024-07-17 DIAGNOSIS — Z1231 Encounter for screening mammogram for malignant neoplasm of breast: Secondary | ICD-10-CM | POA: Diagnosis present

## 2024-07-17 DIAGNOSIS — Z0000 Encounter for general adult medical examination without abnormal findings: Secondary | ICD-10-CM | POA: Diagnosis not present

## 2024-07-17 MED ORDER — AMPHETAMINE-DEXTROAMPHETAMINE 30 MG PO TABS: 30.0000 mg | ORAL_TABLET | Freq: Three times a day (TID) | ORAL | 0 refills | Status: DC

## 2024-07-17 NOTE — Progress Notes (Signed)
 Subjective:    Patient ID: Desiree Reese, female    DOB: [birth year], 48 y.o.   MRN: [id number]  Chief Complaint  Patient presents with   Annual Exam    HPI Patient is in today for cpe.   Discussed the use of AI scribe software for clinical note transcription with the patient, who gave verbal consent to proceed.  History of Present Illness Desiree Reese is a 48 year old female who presents for an annual physical exam and medication refill.  She is here for her annual physical exam and to refill her Adderall prescription. It has been approximately two years since her last physical exam. She has scheduled a mammogram and discussed the timing of her Pap smear, noting that she is on the tail end of her period and may need to reschedule.  She received her flu shot prior to the visit and discussed her tetanus vaccination, which she believes was last administered during her pregnancy with her son, who is now 45 years old.  She is self-employed in Chief Financial Officer, focusing on [employer] and incorporating AI into her strategies. She exercises by doing yoga a couple of times a week and has recently resumed dental care after a lapse of three to four years. She consumes alcohol occasionally, about four to five days a week, typically a glass or two of wine. She does not smoke or use drugs.  Her mother has dementia and lives independently in a townhouse, with her father visiting almost daily. She and her family are considering future care options for her mother, including in-home care or moving her to a facility with levels of care. She monitors her mother's security system remotely to ensure her safety.  No menopause, regular periods. No new surgeries or changes in family history were reported.   Past Medical History:  Diagnosis Date   Active labor 12/24/2014   Active labor 12/24/2014   Anxiety    Depression    Fibroid    Post-term pregnancy, 40-42 weeks of gestation 12/24/2014     Past Surgical History:  Procedure Laterality Date   CESAREAN SECTION     CESAREAN SECTION N/A 12/25/2014   Procedure: CESAREAN SECTION;  Surgeon: Burnard Bowers, MD;  Location: [facility];  Service: Obstetrics;  Laterality: N/A;   TONSILLECTOMY      Family History  Problem Relation Age of Onset   Dementia Mother    Healthy Brother     Social History   Socioeconomic History   Marital status: Married    Spouse name: Not on file   Number of children: Not on file   Years of education: Not on file   Highest education level: Bachelor's degree (e.g., BA, AB, BS)  Occupational History   Occupation: marketing  Tobacco Use   Smoking status: Never   Smokeless tobacco: Never  Substance and Sexual Activity   Alcohol use: Yes    Alcohol/week: 4.0 standard drinks of alcohol    Types: 4 Glasses of wine per week   Drug use: No   Sexual activity: Yes    Partners: Male  Other Topics Concern   Not on file  Social History Narrative   Exercise ---- yoga 2-3 x a week   Social Drivers of Corporate investment banker Strain: Low Risk  (07/17/2024)   Overall Financial Resource Strain (CARDIA)    Difficulty of Paying Living Expenses: Not hard at all  Food Insecurity: No Food Insecurity (07/17/2024)   Hunger Vital Sign  Worried About Programme researcher, broadcasting/film/video in the Last Year: Never true    Ran Out of Food in the Last Year: Never true  Transportation Needs: No Transportation Needs (07/17/2024)   PRAPARE - Administrator, Civil Service (Medical): No    Lack of Transportation (Non-Medical): No  Physical Activity: Insufficiently Active (07/17/2024)   Exercise Vital Sign    Days of Exercise per Week: 2 days    Minutes of Exercise per Session: 30 min  Stress: Stress Concern Present (07/17/2024)   [employer] of Occupational Health - Occupational Stress Questionnaire    Feeling of Stress: To some extent  Social Connections: Moderately Integrated (07/17/2024)   Social Connection and  Isolation Panel    Frequency of Communication with Friends and Family: More than three times a week    Frequency of Social Gatherings with Friends and Family: Once a week    Attends Religious Services: Never    Database administrator or Organizations: Yes    Attends Engineer, structural: More than 4 times per year    Marital Status: Married  Catering manager Violence: Not on file    Outpatient Medications Prior to Visit  Medication Sig Dispense Refill   clobetasol  (TEMOVATE ) 0.05 % external solution Apply topically 2 (two) times daily. 50 mL 1   Semaglutide ,0.25 or 0.5MG /DOS, (OZEMPIC , 0.25 OR 0.5 MG/DOSE,) 2 MG/3ML SOPN 0.25 mg mg weekly     amphetamine -dextroamphetamine  (ADDERALL) 20 MG tablet Take 1 tablet (20 mg total) by mouth in the morning, at noon, and at bedtime. 90 tablet 0   buPROPion  (WELLBUTRIN  XL) 300 MG 24 hr tablet Take 1 tablet (300 mg total) by mouth daily. 90 tablet 3   fexofenadine -pseudoephedrine (ALLEGRA-D ALLERGY & CONGESTION) 180-240 MG 24 hr tablet Take 1 tablet by mouth daily. 30 tablet 2   fluticasone  (FLONASE ) 50 MCG/ACT nasal spray Place 2 sprays into both nostrils daily. 16 g 2   No facility-administered medications prior to visit.    No Known Allergies  Review of Systems  Constitutional:  Negative for chills, fever and malaise/fatigue.  HENT:  Negative for congestion and hearing loss.   Eyes:  Negative for blurred vision and discharge.  Respiratory:  Negative for cough, sputum production and shortness of breath.   Cardiovascular:  Negative for chest pain, palpitations and leg swelling.  Gastrointestinal:  Negative for abdominal pain, blood in stool, constipation, diarrhea, heartburn, nausea and vomiting.  Genitourinary:  Negative for dysuria, frequency, hematuria and urgency.  Musculoskeletal:  Negative for back pain, falls and myalgias.  Skin:  Negative for rash.  Neurological:  Negative for dizziness, sensory change, loss of consciousness,  weakness and headaches.  Endo/Heme/Allergies:  Negative for environmental allergies. Does not bruise/bleed easily.  Psychiatric/Behavioral:  Negative for depression and suicidal ideas. The patient is not nervous/anxious and does not have insomnia.        Objective:    Physical Exam Vitals and nursing note reviewed.  Constitutional:      General: She is not in acute distress.    Appearance: Normal appearance. She is well-developed.  HENT:     Head: Normocephalic and atraumatic.     Right Ear: Tympanic membrane, ear canal and external ear normal. There is no impacted cerumen.     Left Ear: Tympanic membrane, ear canal and external ear normal. There is no impacted cerumen.     Nose: Nose normal.     Mouth/Throat:     Mouth: Mucous membranes are  moist.     Pharynx: Oropharynx is clear. No oropharyngeal exudate or posterior oropharyngeal erythema.  Eyes:     General: No scleral icterus.       Right eye: No discharge.        Left eye: No discharge.     Conjunctiva/sclera: Conjunctivae normal.     Pupils: Pupils are equal, round, and reactive to light.  Neck:     Thyroid: No thyromegaly or thyroid tenderness.     Vascular: No JVD.  Cardiovascular:     Rate and Rhythm: Normal rate and regular rhythm.     Heart sounds: Normal heart sounds. No murmur heard. Pulmonary:     Effort: Pulmonary effort is normal. No respiratory distress.     Breath sounds: Normal breath sounds.  Abdominal:     General: Bowel sounds are normal. There is no distension.     Palpations: Abdomen is soft. There is no mass.     Tenderness: There is no abdominal tenderness. There is no guarding or rebound.  Musculoskeletal:        General: Normal range of motion.     Cervical back: Normal range of motion and neck supple.     Right lower leg: No edema.     Left lower leg: No edema.  Lymphadenopathy:     Cervical: No cervical adenopathy.  Skin:    General: Skin is warm and dry.     Findings: No erythema or  rash.  Neurological:     Mental Status: She is alert and oriented to person, place, and time.     Cranial Nerves: No cranial nerve deficit.     Deep Tendon Reflexes: Reflexes are normal and symmetric.  Psychiatric:        Mood and Affect: Mood normal.        Behavior: Behavior normal.        Thought Content: Thought content normal.        Judgment: Judgment normal.     BP 118/84 (BP Location: Right Arm, Patient Position: Sitting, Cuff Size: Normal)   Pulse (!) 121   Temp 98 F (36.7 C) (Oral)   Resp 18   Ht 5' 4 (1.626 m)   Wt 156 lb 6.4 oz (70.9 kg)   LMP 07/10/2024   SpO2 99%   Breastfeeding No   BMI 26.85 kg/m  Wt Readings from Last 3 Encounters:  07/17/24 156 lb 6.4 oz (70.9 kg)  12/31/23 176 lb 6.4 oz (80 kg)  08/02/22 160 lb 6.4 oz (72.8 kg)    Diabetic Foot Exam - Simple   No data filed    Lab Results  Component Value Date   WBC 9.3 10/20/2020   HGB 13.9 10/20/2020   HCT 40.9 10/20/2020   PLT 286.0 10/20/2020   GLUCOSE 100 (H) 10/20/2020   CHOL 197 10/20/2020   TRIG 84.0 10/20/2020   HDL 74.90 10/20/2020   LDLCALC 105 (H) 10/20/2020   ALT 10 10/20/2020   AST 11 10/20/2020   NA 140 10/20/2020   K 3.7 10/20/2020   CL 104 10/20/2020   CREATININE 0.80 10/20/2020   BUN 16 10/20/2020   CO2 30 10/20/2020   TSH 3.78 10/20/2020    Lab Results  Component Value Date   TSH 3.78 10/20/2020   Lab Results  Component Value Date   WBC 9.3 10/20/2020   HGB 13.9 10/20/2020   HCT 40.9 10/20/2020   MCV 91.2 10/20/2020   PLT 286.0 10/20/2020   Lab  Results  Component Value Date   NA 140 10/20/2020   K 3.7 10/20/2020   CO2 30 10/20/2020   GLUCOSE 100 (H) 10/20/2020   BUN 16 10/20/2020   CREATININE 0.80 10/20/2020   BILITOT 0.4 10/20/2020   ALKPHOS 77 10/20/2020   AST 11 10/20/2020   ALT 10 10/20/2020   PROT 6.9 10/20/2020   ALBUMIN 4.5 10/20/2020   CALCIUM 9.6 10/20/2020   GFR 90.55 10/20/2020   Lab Results  Component Value Date   CHOL 197  10/20/2020   Lab Results  Component Value Date   HDL 74.90 10/20/2020   Lab Results  Component Value Date   LDLCALC 105 (H) 10/20/2020   Lab Results  Component Value Date   TRIG 84.0 10/20/2020   Lab Results  Component Value Date   CHOLHDL 3 10/20/2020   No results found for: HGBA1C     Assessment & Plan:  Preventative health care -     CBC with Differential/Platelet -     Comprehensive metabolic panel with GFR -     Lipid panel -     TSH  Need for influenza vaccination -     Flu vaccine trivalent PF, 6mos and older(Flulaval,Afluria,Fluarix,Fluzone)  Attention deficit disorder (ADD) in adult -     Amphetamine -Dextroamphetamine ; Take 1 tablet by mouth 3 (three) times daily.  Dispense: 90 tablet; Refill: 0  Need for Tdap vaccination -     Tdap vaccine greater than or equal to 7yo IM  Colon cancer screening -     Ambulatory referral to Gastroenterology  Need for hepatitis C screening test -     Hepatitis C antibody   Assessment and Plan Assessment & Plan Adult Wellness Visit   This routine adult wellness visit focused on preventive care and lifestyle review. Perform blood work and administer the tetanus vaccine today. Refer to Dr. Nandigam for a colonoscopy. Schedule a Pap smear for next year or sooner if desired.  General Health Maintenance   Emphasized the importance of vaccinations, screenings, and lifestyle modifications. Administer the tetanus vaccine today. Schedule a colonoscopy with Dr. Nandigam. Schedule a Pap smear for next year or sooner if desired.  Goals of Care   Discussed her mother's dementia and future care planning, highlighting its progressive nature. Educate the family on dementia progression and care options. Consider in-home care or assisted living facilities for her mother.   Lorene Klimas R Lowne Chase, DO

## 2024-07-18 LAB — CBC WITH DIFFERENTIAL/PLATELET
Absolute Lymphocytes: 1693 {cells}/uL (ref 850–3900)
Absolute Monocytes: 592 {cells}/uL (ref 200–950)
Basophils Absolute: 48 {cells}/uL (ref 0–200)
Basophils Relative: 0.7 %
Eosinophils Absolute: 190 {cells}/uL (ref 15–500)
Eosinophils Relative: 2.8 %
HCT: 44.6 % (ref 35.0–45.0)
Hemoglobin: 14.6 g/dL (ref 11.7–15.5)
MCH: 30.2 pg (ref 27.0–33.0)
MCHC: 32.7 g/dL (ref 32.0–36.0)
MCV: 92.1 fL (ref 80.0–100.0)
MPV: 10.3 fL (ref 7.5–12.5)
Monocytes Relative: 8.7 %
Neutro Abs: 4277 {cells}/uL (ref 1500–7800)
Neutrophils Relative %: 62.9 %
Platelets: 330 Thousand/uL (ref 140–400)
RBC: 4.84 Million/uL (ref 3.80–5.10)
RDW: 12.1 % (ref 11.0–15.0)
Total Lymphocyte: 24.9 %
WBC: 6.8 Thousand/uL (ref 3.8–10.8)

## 2024-07-18 LAB — TSH: TSH: 4.61 m[IU]/L — ABNORMAL HIGH

## 2024-07-18 LAB — LIPID PANEL
Cholesterol: 157 mg/dL (ref <200)
HDL: 62 mg/dL (ref >=50)
LDL Cholesterol (Calc): 79 mg/dL
Non-HDL Cholesterol (Calc): 95 mg/dL (ref <130)
Total CHOL/HDL Ratio: 2.5 (calc) (ref <5.0)
Triglycerides: 76 mg/dL (ref <150)

## 2024-07-18 LAB — COMPREHENSIVE METABOLIC PANEL WITH GFR
AG Ratio: 1.9 (calc) (ref 1.0–2.5)
ALT: 10 U/L (ref 6–29)
AST: 11 U/L (ref 10–35)
Albumin: 4.3 g/dL (ref 3.6–5.1)
Alkaline phosphatase (APISO): 74 U/L (ref 31–125)
BUN: 19 mg/dL (ref 7–25)
CO2: 31 mmol/L (ref 20–32)
Calcium: 9.6 mg/dL (ref 8.6–10.2)
Chloride: 105 mmol/L (ref 98–110)
Creat: 0.96 mg/dL (ref 0.50–0.99)
Globulin: 2.3 g/dL (ref 1.9–3.7)
Glucose, Bld: 79 mg/dL (ref 65–99)
Potassium: 4.8 mmol/L (ref 3.5–5.3)
Sodium: 140 mmol/L (ref 135–146)
Total Bilirubin: 0.4 mg/dL (ref 0.2–1.2)
Total Protein: 6.6 g/dL (ref 6.1–8.1)
eGFR: 74 mL/min/1.73m2 (ref >=60)

## 2024-07-18 LAB — HEPATITIS C ANTIBODY: Hepatitis C Ab: NONREACTIVE

## 2024-07-26 ENCOUNTER — Ambulatory Visit: Payer: Self-pay | Admitting: Family Medicine

## 2024-07-26 ENCOUNTER — Other Ambulatory Visit: Payer: Self-pay | Admitting: Family Medicine

## 2024-07-26 DIAGNOSIS — E039 Hypothyroidism, unspecified: Secondary | ICD-10-CM

## 2024-09-04 ENCOUNTER — Encounter: Payer: Self-pay | Admitting: Family Medicine

## 2024-09-04 DIAGNOSIS — F988 Other specified behavioral and emotional disorders with onset usually occurring in childhood and adolescence: Secondary | ICD-10-CM

## 2024-09-04 MED ORDER — AMPHETAMINE-DEXTROAMPHETAMINE 30 MG PO TABS: 30.0000 mg | ORAL_TABLET | Freq: Three times a day (TID) | ORAL | 0 refills | Status: AC

## 2024-09-04 NOTE — Telephone Encounter (Signed)
 Requesting: Adderall 30mg   Contract:12/31/23 UDS:12/31/23 Last Visit:07/17/24 Next Visit: None Last Refill: 07/17/24 #90 and 0RF   Please Advise

## 2024-10-09 ENCOUNTER — Other Ambulatory Visit: Payer: Self-pay | Admitting: Family Medicine

## 2024-10-09 DIAGNOSIS — F988 Other specified behavioral and emotional disorders with onset usually occurring in childhood and adolescence: Secondary | ICD-10-CM

## 2024-10-12 NOTE — Telephone Encounter (Signed)
 Requesting: Adderall 30mg   Contract:12/31/23 UDS:12/31/23 Last Visit: 07/17/24 Next Visit: None Last Refill: 09/04/24 #90 and 0RF   Please Advise

## 2024-10-19 ENCOUNTER — Encounter: Payer: Self-pay | Admitting: Family Medicine

## 2024-10-26 ENCOUNTER — Other Ambulatory Visit

## 2024-11-17 ENCOUNTER — Encounter: Payer: Self-pay | Admitting: Family Medicine

## 2024-11-17 DIAGNOSIS — F988 Other specified behavioral and emotional disorders with onset usually occurring in childhood and adolescence: Secondary | ICD-10-CM

## 2024-11-17 MED ORDER — AMPHETAMINE-DEXTROAMPHETAMINE 20 MG PO TABS: 20.0000 mg | ORAL_TABLET | Freq: Three times a day (TID) | ORAL | 0 refills | Status: AC
